# Patient Record
Sex: Female | Born: 1970 | Race: White | Hispanic: No | Marital: Married | Smoking: Current every day smoker
Health system: Southern US, Community
[De-identification: ages and names within clinical notes are randomized; demographics above are authoritative.]

## PROBLEM LIST (undated history)

## (undated) DIAGNOSIS — M542 Cervicalgia: Secondary | ICD-10-CM

## (undated) DIAGNOSIS — G8929 Other chronic pain: Secondary | ICD-10-CM

## (undated) DIAGNOSIS — I10 Essential (primary) hypertension: Secondary | ICD-10-CM

## (undated) DIAGNOSIS — K219 Gastro-esophageal reflux disease without esophagitis: Secondary | ICD-10-CM

## (undated) HISTORY — DX: Essential (primary) hypertension: I10

## (undated) HISTORY — PX: VAGINAL HYSTERECTOMY: SUR661

## (undated) HISTORY — PX: CHOLECYSTECTOMY: SHX55

## (undated) HISTORY — DX: Gastro-esophageal reflux disease without esophagitis: K21.9

---

## 1998-06-13 HISTORY — PX: CHOLECYSTECTOMY: SHX55

## 2007-06-14 HISTORY — PX: VAGINAL HYSTERECTOMY: SUR661

## 2013-09-24 ENCOUNTER — Encounter (INDEPENDENT_AMBULATORY_CARE_PROVIDER_SITE_OTHER): Payer: Self-pay | Admitting: General Surgery

## 2013-09-24 ENCOUNTER — Telehealth (INDEPENDENT_AMBULATORY_CARE_PROVIDER_SITE_OTHER): Payer: Self-pay

## 2013-09-24 ENCOUNTER — Ambulatory Visit (INDEPENDENT_AMBULATORY_CARE_PROVIDER_SITE_OTHER): Payer: PRIVATE HEALTH INSURANCE | Admitting: General Surgery

## 2013-09-24 VITALS — BP 125/75 | HR 61 | Temp 96.8°F | Resp 14 | Ht 69.0 in | Wt 154.0 lb

## 2013-09-24 DIAGNOSIS — E041 Nontoxic single thyroid nodule: Secondary | ICD-10-CM

## 2013-09-24 NOTE — Telephone Encounter (Signed)
Office notes from 09/24/13 w/Dr. Rosendo Gros faxed to Dr. Marni Griffon @ [phone].

## 2013-09-24 NOTE — Progress Notes (Signed)
Patient ID: Amanda Benton, female   DOB: [birth year], 43 y.o.   MRN: [id number]  Chief Complaint  Patient presents with  . cell lesion    HPI Amanda Benton is a 43 y.o. female.  The patient is a 43 year old female who is referred by Dr. 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 Patient states that she's had some dysphagia with eating for several years. She states that her recent bout of nausea vomiting caused this nauseates well and was brought to the attention of Dr. Henrene Pastor.  She underwent ultrasound FNA biopsy which revealed a questionable hurtle cell neoplasm, and a benign nodule.  HPI  Past Medical History  Diagnosis Date  . GERD (gastroesophageal reflux disease)   . Hypertension     Past Surgical History  Procedure Laterality Date  . Abdominal hysterectomy      Family History  Problem Relation Age of Onset  . Heart disease Father   . Cancer Sister     breast    Social History History  Substance Use Topics  . Smoking status: Current Every Day Smoker -- 1.00 packs/day  . Smokeless tobacco: Not on file  . Alcohol Use: Yes    No Known Allergies  Current Outpatient Prescriptions  Medication Sig Dispense Refill  . ibuprofen (ADVIL,MOTRIN) 200 MG tablet Take 200 mg by mouth every 6 (six) hours as needed.       No current facility-administered medications for this visit.    Review of Systems Review of Systems  Constitutional: Negative.   HENT: Negative.   Respiratory: Negative.   Cardiovascular: Negative.   Gastrointestinal: Negative.   Neurological: Negative.   All other systems reviewed and are negative.   Blood pressure 125/75, pulse 61, temperature 96.8 F (36 C), resp. rate 14, height 5\' 9"  (1.753 m), weight 154 lb (69.854 kg).  Physical Exam Physical Exam  Constitutional: She is oriented to person, place, and time. She appears well-developed and well-nourished.  HENT:  Head: Normocephalic and atraumatic.  Eyes:  Conjunctivae and EOM are normal. Pupils are equal, round, and reactive to light.  Neck: Normal range of motion. Neck supple. No thyromegaly present.  Cardiovascular: Normal rate, regular rhythm and normal heart sounds.   Pulmonary/Chest: Effort normal and breath sounds normal.  Abdominal: Soft. Bowel sounds are normal. She exhibits no distension. There is no tenderness. There is no rebound.  Musculoskeletal: Normal range of motion.  Neurological: She is alert and oriented to person, place, and time.  Skin: Skin is warm and dry.  Psychiatric: She has a normal mood and affect.    Data Reviewed As above  Assessment    44 year old female with a right thyroid nodule, possible Hrthle cell/neoplasm cannot be ruled out     Plan    1. We'll proceed to operative room for a right thyroid lobectomy. 2. All risks and benefits were discussed with the patient to generally include: infection, bleeding, damage to the recurrent laryngeal nerve, damage to superior and inferior parathyroid glands.  Alternatives were offered and described.  All questions were answered and the patient voiced understanding of the procedure and wishes to proceed at this point with a laparoscopic cholecystectomy        Amanda Benton 09/24/2013, 1:50 PM

## 2013-09-30 ENCOUNTER — Encounter (INDEPENDENT_AMBULATORY_CARE_PROVIDER_SITE_OTHER): Payer: Self-pay

## 2013-10-02 ENCOUNTER — Encounter (HOSPITAL_COMMUNITY): Payer: Self-pay | Admitting: Pharmacy Technician

## 2013-10-05 NOTE — Pre-Procedure Instructions (Signed)
[facility] - Preparing for Surgery  Before surgery, you can play an important role.  Because skin is not sterile, your skin needs to be as free of germs as possible.  You can reduce the number of germs on you skin by washing with CHG (chlorahexidine gluconate) soap before surgery.  CHG is an antiseptic cleaner which kills germs and bonds with the skin to continue killing germs even after washing.  Please DO NOT use if you have an allergy to CHG or antibacterial soaps.  If your skin becomes reddened/irritated stop using the CHG and inform your nurse when you arrive at Short Stay.  Do not shave (including legs and underarms) for at least 48 hours prior to the first CHG shower.  You may shave your face.  Please follow these instructions carefully:   1.  Shower with CHG Soap the night before surgery and the morning of Surgery.  2.  If you choose to wash your hair, wash your hair first as usual with your normal shampoo.  3.  After you shampoo, rinse your hair and body thoroughly to remove the shampoo.  4.  Use CHG as you would any other liquid soap.  You can apply CHG directly to the skin and wash gently with scrungie or a clean washcloth.  5.  Apply the CHG Soap to your body ONLY FROM THE NECK DOWN.  Do not use on open wounds or open sores.  Avoid contact with your eyes, ears, mouth and genitals (private parts).  Wash genitals (private parts) with your normal soap.  6.  Wash thoroughly, paying special attention to the area where your surgery will be performed.  7.  Thoroughly rinse your body with warm water from the neck down.  8.  DO NOT shower/wash with your normal soap after using and rinsing off the CHG Soap.  9.  Pat yourself dry with a clean towel.            10.  Wear clean pajamas.            11.  Place clean sheets on your bed the night of your first shower and do not sleep with pets.  Day of Surgery  Do not apply any lotions the morning of surgery.  Please wear clean clothes to the  hospital/surgery center.   

## 2013-10-05 NOTE — Pre-Procedure Instructions (Signed)
Amanda Benton  10/05/2013   Your procedure is scheduled on:  May 6  Report to [facility] Admitting at 1 PM.  Call this number if you have problems the morning of surgery: [phone]   Remember:   Do not eat food or drink liquids after midnight.   Take these medicines the morning of surgery with A SIP OF WATER: Tylenol (if needed)   STOP Ibuprofen April 29   STOP/ Do not take Aspirin, Aleve, Naproxen, Advil, Ibuprofen, Vitamin, Herbs, or Supplements starting April 29   Do not wear jewelry, make-up or nail polish.  Do not wear lotions, powders, or perfumes. You may wear deodorant.  Do not shave 48 hours prior to surgery. Men may shave face and neck.  Do not bring valuables to the hospital.  [facility] is not responsible  for any belongings or valuables.               Contacts, dentures or bridgework may not be worn into surgery.  Leave suitcase in the car. After surgery it may be brought to your room.  For patients admitted to the hospital, discharge time is determined by your treatment team.               Patients discharged the day of surgery will not be allowed to drive home.  Name and phone number of your driver: Family/ Friend  Special Instructions: See [facility] Preparing For Surgery   Please read over the following fact sheets that you were given: Pain Booklet, Coughing and Deep Breathing and Surgical Site Infection Prevention

## 2013-10-07 ENCOUNTER — Encounter (HOSPITAL_COMMUNITY)
Admission: RE | Admit: 2013-10-07 | Discharge: 2013-10-07 | Disposition: A | Discharge status: Home / Self Care | Payer: No Typology Code available for payment source | Source: Ambulatory Visit | Attending: Anesthesiology | Admitting: Anesthesiology

## 2013-10-07 ENCOUNTER — Encounter (HOSPITAL_COMMUNITY): Payer: Self-pay

## 2013-10-07 ENCOUNTER — Encounter (HOSPITAL_COMMUNITY)
Admission: RE | Admit: 2013-10-07 | Discharge: 2013-10-07 | Disposition: A | Discharge status: Home / Self Care | Payer: No Typology Code available for payment source | Source: Ambulatory Visit | Attending: General Surgery | Admitting: General Surgery

## 2013-10-07 DIAGNOSIS — Z01810 Encounter for preprocedural cardiovascular examination: Secondary | ICD-10-CM | POA: Insufficient documentation

## 2013-10-07 DIAGNOSIS — Z01812 Encounter for preprocedural laboratory examination: Secondary | ICD-10-CM | POA: Insufficient documentation

## 2013-10-07 DIAGNOSIS — Z01818 Encounter for other preprocedural examination: Secondary | ICD-10-CM | POA: Insufficient documentation

## 2013-10-07 LAB — BASIC METABOLIC PANEL
BUN: 12 mg/dL (ref 6–23)
CHLORIDE: 100 meq/L (ref 96–112)
CO2: 25 mEq/L (ref 19–32)
CREATININE: 0.84 mg/dL (ref 0.50–1.10)
Calcium: 9.7 mg/dL (ref 8.4–10.5)
GFR calc non Af Amer: 83 mL/min — ABNORMAL LOW (ref >90)
Glucose, Bld: 99 mg/dL (ref 70–99)
Potassium: 4.3 mEq/L (ref 3.7–5.3)
Sodium: 138 mEq/L (ref 137–147)

## 2013-10-07 LAB — CBC
HEMATOCRIT: 40.9 % (ref 36.0–46.0)
Hemoglobin: 14.3 g/dL (ref 12.0–15.0)
MCH: 31 pg (ref 26.0–34.0)
MCHC: 35 g/dL (ref 30.0–36.0)
MCV: 88.7 fL (ref 78.0–100.0)
PLATELETS: 189 10*3/uL (ref 150–400)
RBC: 4.61 MIL/uL (ref 3.87–5.11)
RDW: 13.1 % (ref 11.5–15.5)
WBC: 7.4 10*3/uL (ref 4.0–10.5)

## 2013-10-07 NOTE — Progress Notes (Signed)
Primary physician - dr. Henrene Pastor at [facility] Stress test, ekg several years ago - [facility] she thinks - will request

## 2013-10-08 NOTE — Progress Notes (Signed)
Anesthesia Chart Review:  Patient is a 43 year old female scheduled for right thyroid lobectomy for nodules on 10/16/13 by Dr. Rosendo Gros.    History includes HTN, GERD, smoking, hysterectomy, cholecystectomy.  PCP is Dr. Henrene Pastor at [facility]. She lives in [place].  EKG on 10/07/13 showed marked SB @ 46 bpm, incomplete right BBB. (HR was previously documented as 50 and 61 bpm in Epic since 09/24/13.)  She saw cardiologist Dr. Desiree Lucy in 05/2011 for chest pain, incomplete right BBB on EKG, with family history of early onset CAD.  He ordered a stress echo which was done on 06/23/11 and showed: Normal stress echo, negative for ischemia, LVEF 55-60%, normal LVF with no regional wall motion abnormalities.  PRN cardiologist follow-up was recommended at that time. 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    Preoperative CXR and labs noted.   If no acute changes, new CV symptoms, or symptomatic bradycardia then I would anticipate that she could proceed as planned.  Further evaluation by her assigned anesthesiologist on the day of surgery.  [facility] Short Stay Center/Anesthesiology Phone [phone] 10/08/2013 4:58 PM

## 2013-10-15 MED ORDER — CEFAZOLIN SODIUM-DEXTROSE 2-3 GM-% IV SOLR
2.0000 g | INTRAVENOUS | Status: AC
  Administered 2013-10-16: 2 g via INTRAVENOUS
  Filled 2013-10-15: qty 50

## 2013-10-16 ENCOUNTER — Encounter (HOSPITAL_COMMUNITY): Payer: Self-pay | Admitting: *Deleted

## 2013-10-16 ENCOUNTER — Encounter (HOSPITAL_COMMUNITY)
Admission: RE | Disposition: A | Discharge status: Home / Self Care | Payer: Self-pay | Source: Ambulatory Visit | Attending: General Surgery

## 2013-10-16 ENCOUNTER — Encounter (HOSPITAL_COMMUNITY): Payer: No Typology Code available for payment source | Admitting: Vascular Surgery

## 2013-10-16 ENCOUNTER — Observation Stay (HOSPITAL_COMMUNITY)
Admission: RE | Admit: 2013-10-16 | Discharge: 2013-10-17 | Disposition: A | Discharge status: Home / Self Care | Payer: No Typology Code available for payment source | Source: Ambulatory Visit | Attending: General Surgery | Admitting: General Surgery

## 2013-10-16 ENCOUNTER — Ambulatory Visit (HOSPITAL_COMMUNITY): Payer: No Typology Code available for payment source | Admitting: Certified Registered"

## 2013-10-16 DIAGNOSIS — F17200 Nicotine dependence, unspecified, uncomplicated: Secondary | ICD-10-CM | POA: Insufficient documentation

## 2013-10-16 DIAGNOSIS — I10 Essential (primary) hypertension: Secondary | ICD-10-CM | POA: Insufficient documentation

## 2013-10-16 DIAGNOSIS — Z9889 Other specified postprocedural states: Secondary | ICD-10-CM

## 2013-10-16 DIAGNOSIS — D34 Benign neoplasm of thyroid gland: Secondary | ICD-10-CM

## 2013-10-16 DIAGNOSIS — K219 Gastro-esophageal reflux disease without esophagitis: Secondary | ICD-10-CM | POA: Insufficient documentation

## 2013-10-16 DIAGNOSIS — E890 Postprocedural hypothyroidism: Secondary | ICD-10-CM

## 2013-10-16 DIAGNOSIS — E041 Nontoxic single thyroid nodule: Principal | ICD-10-CM | POA: Insufficient documentation

## 2013-10-16 HISTORY — DX: Postprocedural hypothyroidism: E89.0

## 2013-10-16 HISTORY — PX: THYROID LOBECTOMY: SHX420

## 2013-10-16 HISTORY — DX: Cervicalgia: M54.2

## 2013-10-16 HISTORY — DX: Other chronic pain: G89.29

## 2013-10-16 SURGERY — LOBECTOMY, THYROID
Anesthesia: General | Site: Neck | Laterality: Right

## 2013-10-16 MED ORDER — MIDAZOLAM HCL 5 MG/5ML IJ SOLN
INTRAMUSCULAR | Status: DC | PRN
  Administered 2013-10-16: 2 mg via INTRAVENOUS

## 2013-10-16 MED ORDER — CHLORHEXIDINE GLUCONATE 4 % EX LIQD
1.0000 "application " | Freq: Once | CUTANEOUS | Status: DC
  Filled 2013-10-16: qty 15

## 2013-10-16 MED ORDER — SODIUM CHLORIDE 0.9 % IJ SOLN
INTRAMUSCULAR | Status: AC
  Filled 2013-10-16: qty 10

## 2013-10-16 MED ORDER — ROCURONIUM BROMIDE 100 MG/10ML IV SOLN
INTRAVENOUS | Status: DC | PRN
  Administered 2013-10-16: 40 mg via INTRAVENOUS

## 2013-10-16 MED ORDER — PROMETHAZINE HCL 25 MG/ML IJ SOLN
6.2500 mg | INTRAMUSCULAR | Status: DC | PRN
  Administered 2013-10-16: 6.25 mg via INTRAVENOUS

## 2013-10-16 MED ORDER — DEXTROSE-NACL 5-0.9 % IV SOLN
INTRAVENOUS | Status: DC
  Administered 2013-10-16: 21:00:00 via INTRAVENOUS

## 2013-10-16 MED ORDER — OXYCODONE HCL 5 MG PO TABS: 5.0000 mg | ORAL_TABLET | ORAL | Status: DC | PRN

## 2013-10-16 MED ORDER — ONDANSETRON HCL 4 MG/2ML IJ SOLN
4.0000 mg | Freq: Four times a day (QID) | INTRAMUSCULAR | Status: DC | PRN
  Administered 2013-10-16: 4 mg via INTRAVENOUS
  Filled 2013-10-16: qty 2

## 2013-10-16 MED ORDER — BUPIVACAINE HCL (PF) 0.25 % IJ SOLN
INTRAMUSCULAR | Status: AC
  Filled 2013-10-16: qty 30

## 2013-10-16 MED ORDER — BUPIVACAINE HCL 0.25 % IJ SOLN
INTRAMUSCULAR | Status: DC | PRN
  Administered 2013-10-16: 10 mL

## 2013-10-16 MED ORDER — 0.9 % SODIUM CHLORIDE (POUR BTL) OPTIME
TOPICAL | Status: DC | PRN
  Administered 2013-10-16: 1000 mL

## 2013-10-16 MED ORDER — LIDOCAINE HCL (CARDIAC) 20 MG/ML IV SOLN
INTRAVENOUS | Status: DC | PRN
  Administered 2013-10-16: 80 mg via INTRAVENOUS

## 2013-10-16 MED ORDER — PROPOFOL 10 MG/ML IV BOLUS
INTRAVENOUS | Status: AC
  Filled 2013-10-16: qty 20

## 2013-10-16 MED ORDER — MIDAZOLAM HCL 2 MG/2ML IJ SOLN
INTRAMUSCULAR | Status: AC
  Filled 2013-10-16: qty 2

## 2013-10-16 MED ORDER — SUFENTANIL CITRATE 50 MCG/ML IV SOLN
INTRAVENOUS | Status: AC
  Filled 2013-10-16: qty 1

## 2013-10-16 MED ORDER — SUFENTANIL CITRATE 50 MCG/ML IV SOLN
INTRAVENOUS | Status: DC | PRN
  Administered 2013-10-16: 20 ug via INTRAVENOUS
  Administered 2013-10-16: 5 ug via INTRAVENOUS

## 2013-10-16 MED ORDER — ROCURONIUM BROMIDE 50 MG/5ML IV SOLN
INTRAVENOUS | Status: AC
  Filled 2013-10-16: qty 1

## 2013-10-16 MED ORDER — HYDROMORPHONE HCL PF 1 MG/ML IJ SOLN
INTRAMUSCULAR | Status: AC
  Administered 2013-10-16: 0.5 mg via INTRAVENOUS
  Filled 2013-10-16: qty 1

## 2013-10-16 MED ORDER — OXYCODONE HCL 5 MG PO TABS: 5.0000 mg | ORAL_TABLET | Freq: Once | ORAL | Status: DC | PRN

## 2013-10-16 MED ORDER — OXYCODONE HCL 5 MG/5ML PO SOLN: 5.0000 mg | Freq: Once | ORAL | Status: DC | PRN

## 2013-10-16 MED ORDER — HEMOSTATIC AGENTS (NO CHARGE) OPTIME
TOPICAL | Status: DC | PRN
  Administered 2013-10-16: 1 via TOPICAL

## 2013-10-16 MED ORDER — HYDROMORPHONE HCL PF 1 MG/ML IJ SOLN
0.2500 mg | INTRAMUSCULAR | Status: DC | PRN
  Administered 2013-10-16 (×2): 0.5 mg via INTRAVENOUS

## 2013-10-16 MED ORDER — OXYCODONE-ACETAMINOPHEN 10-325 MG PO TABS: 1.0000 | ORAL_TABLET | ORAL | Status: DC | PRN

## 2013-10-16 MED ORDER — MIDAZOLAM HCL 2 MG/2ML IJ SOLN: 1.0000 mg | INTRAMUSCULAR | Status: DC | PRN

## 2013-10-16 MED ORDER — LIDOCAINE HCL (CARDIAC) 20 MG/ML IV SOLN
INTRAVENOUS | Status: AC
  Filled 2013-10-16: qty 5

## 2013-10-16 MED ORDER — HYDROMORPHONE HCL PF 1 MG/ML IJ SOLN
1.0000 mg | INTRAMUSCULAR | Status: DC | PRN
  Administered 2013-10-16: 1 mg via INTRAVENOUS
  Filled 2013-10-16: qty 1

## 2013-10-16 MED ORDER — ONDANSETRON HCL 4 MG/2ML IJ SOLN
INTRAMUSCULAR | Status: DC | PRN
  Administered 2013-10-16: 4 mg via INTRAVENOUS

## 2013-10-16 MED ORDER — ONDANSETRON HCL 4 MG/2ML IJ SOLN
INTRAMUSCULAR | Status: AC
  Filled 2013-10-16: qty 2

## 2013-10-16 MED ORDER — FENTANYL CITRATE 0.05 MG/ML IJ SOLN: 50.0000 ug | Freq: Once | INTRAMUSCULAR | Status: DC

## 2013-10-16 MED ORDER — LACTATED RINGERS IV SOLN
INTRAVENOUS | Status: DC
  Administered 2013-10-16: 13:00:00 via INTRAVENOUS

## 2013-10-16 MED ORDER — PROMETHAZINE HCL 25 MG/ML IJ SOLN: 12.5000 mg | Freq: Once | INTRAMUSCULAR | Status: DC

## 2013-10-16 MED ORDER — PROMETHAZINE HCL 25 MG/ML IJ SOLN
INTRAMUSCULAR | Status: AC
Start: 2013-10-16 — End: 2013-10-17
  Filled 2013-10-16: qty 1

## 2013-10-16 MED ORDER — PHENOL 1.4 % MT LIQD: 1.0000 | OROMUCOSAL | Status: DC | PRN

## 2013-10-16 MED ORDER — PROPOFOL 10 MG/ML IV BOLUS
INTRAVENOUS | Status: DC | PRN
  Administered 2013-10-16: 180 mg via INTRAVENOUS

## 2013-10-16 SURGICAL SUPPLY — 67 items
ATTRACTOMAT 16X20 MAGNETIC DRP (DRAPES) IMPLANT
BENZOIN TINCTURE PRP APPL 2/3 (GAUZE/BANDAGES/DRESSINGS) IMPLANT
BLADE 15 SAFETY STRL DISP (BLADE) IMPLANT
CANISTER SUCTION 2500CC (MISCELLANEOUS) ×3 IMPLANT
CHLORAPREP W/TINT 26ML (MISCELLANEOUS) ×3 IMPLANT
CLIP TI MEDIUM 24 (CLIP) ×3 IMPLANT
CLIP TI WIDE RED SMALL 24 (CLIP) ×3 IMPLANT
CLOSURE WOUND 1/2 X4 (GAUZE/BANDAGES/DRESSINGS)
CONT SPEC 4OZ CLIKSEAL STRL BL (MISCELLANEOUS) IMPLANT
COVER SURGICAL LIGHT HANDLE (MISCELLANEOUS) ×3 IMPLANT
CRADLE DONUT ADULT HEAD (MISCELLANEOUS) ×3 IMPLANT
DERMABOND ADVANCED (GAUZE/BANDAGES/DRESSINGS) ×2
DERMABOND ADVANCED .7 DNX12 (GAUZE/BANDAGES/DRESSINGS) ×1 IMPLANT
DRAPE PED LAPAROTOMY (DRAPES) ×3 IMPLANT
DRAPE SURG 17X23 STRL (DRAPES) ×6 IMPLANT
DRAPE UTILITY 15X26 W/TAPE STR (DRAPE) ×6 IMPLANT
ELECT CAUTERY BLADE 6.4 (BLADE) IMPLANT
ELECT COATED BLADE 2.86 ST (ELECTRODE) ×3 IMPLANT
ELECT REM PT RETURN 9FT ADLT (ELECTROSURGICAL) ×3
ELECTRODE REM PT RTRN 9FT ADLT (ELECTROSURGICAL) ×1 IMPLANT
GAUZE SPONGE 4X4 16PLY XRAY LF (GAUZE/BANDAGES/DRESSINGS) ×6 IMPLANT
GLOVE BIO SURGEON STRL SZ 6.5 (GLOVE) ×2 IMPLANT
GLOVE BIO SURGEON STRL SZ7.5 (GLOVE) ×9 IMPLANT
GLOVE BIO SURGEONS STRL SZ 6.5 (GLOVE) ×1
GLOVE BIOGEL PI IND STRL 7.0 (GLOVE) ×1 IMPLANT
GLOVE BIOGEL PI IND STRL 7.5 (GLOVE) ×2 IMPLANT
GLOVE BIOGEL PI IND STRL 8 (GLOVE) ×1 IMPLANT
GLOVE BIOGEL PI INDICATOR 7.0 (GLOVE) ×2
GLOVE BIOGEL PI INDICATOR 7.5 (GLOVE) ×4
GLOVE BIOGEL PI INDICATOR 8 (GLOVE) ×2
GLOVE SS BIOGEL STRL SZ 6.5 (GLOVE) ×1 IMPLANT
GLOVE SUPERSENSE BIOGEL SZ 6.5 (GLOVE) ×2
GLOVE SURG SS PI 7.0 STRL IVOR (GLOVE) ×3 IMPLANT
GOWN STRL REUS W/ TWL LRG LVL3 (GOWN DISPOSABLE) ×4 IMPLANT
GOWN STRL REUS W/ TWL XL LVL3 (GOWN DISPOSABLE) ×1 IMPLANT
GOWN STRL REUS W/TWL LRG LVL3 (GOWN DISPOSABLE) ×8
GOWN STRL REUS W/TWL XL LVL3 (GOWN DISPOSABLE) ×2
HEMOSTAT SURGICEL 2X14 (HEMOSTASIS) IMPLANT
HEMOSTAT SURGICEL 2X4 FIBR (HEMOSTASIS) ×3 IMPLANT
KIT BASIN OR (CUSTOM PROCEDURE TRAY) ×3 IMPLANT
KIT ROOM TURNOVER OR (KITS) ×3 IMPLANT
NEEDLE HYPO 25GX1X1/2 BEV (NEEDLE) ×3 IMPLANT
NS IRRIG 1000ML POUR BTL (IV SOLUTION) ×3 IMPLANT
PACK SURGICAL SETUP 50X90 (CUSTOM PROCEDURE TRAY) ×3 IMPLANT
PAD ARMBOARD 7.5X6 YLW CONV (MISCELLANEOUS) ×6 IMPLANT
PENCIL BUTTON HOLSTER BLD 10FT (ELECTRODE) ×3 IMPLANT
SHEARS HARMONIC 9CM CVD (BLADE) ×3 IMPLANT
SPECIMEN JAR MEDIUM (MISCELLANEOUS) IMPLANT
SPECIMEN JAR SMALL (MISCELLANEOUS) ×3 IMPLANT
SPONGE GAUZE 4X4 12PLY (GAUZE/BANDAGES/DRESSINGS) IMPLANT
SPONGE INTESTINAL PEANUT (DISPOSABLE) ×3 IMPLANT
STAPLER VISISTAT 35W (STAPLE) IMPLANT
STRIP CLOSURE SKIN 1/2X4 (GAUZE/BANDAGES/DRESSINGS) IMPLANT
SUT MNCRL AB 4-0 PS2 18 (SUTURE) ×3 IMPLANT
SUT SILK 2 0 (SUTURE) ×2
SUT SILK 2-0 18XBRD TIE 12 (SUTURE) ×1 IMPLANT
SUT SILK 3 0 (SUTURE) ×2
SUT SILK 3-0 18XBRD TIE 12 (SUTURE) ×1 IMPLANT
SUT VIC AB 3-0 SH 18 (SUTURE) ×6 IMPLANT
SYR BULB 3OZ (MISCELLANEOUS) ×3 IMPLANT
SYR CONTROL 10ML LL (SYRINGE) ×3 IMPLANT
TOWEL OR 17X24 6PK STRL BLUE (TOWEL DISPOSABLE) IMPLANT
TOWEL OR 17X26 10 PK STRL BLUE (TOWEL DISPOSABLE) ×3 IMPLANT
TOWEL OR NON WOVEN STRL DISP B (DISPOSABLE) ×3 IMPLANT
TUBE CONNECTING 12'X1/4 (SUCTIONS) ×1
TUBE CONNECTING 12X1/4 (SUCTIONS) ×2 IMPLANT
WATER STERILE IRR 1000ML POUR (IV SOLUTION) IMPLANT

## 2013-10-16 NOTE — Op Note (Signed)
10/16/2013  3:32 PM  PATIENT:  Amanda Benton  43 y.o. female  PRE-OPERATIVE DIAGNOSIS:  RIGHT THYROID NODULE  POST-OPERATIVE DIAGNOSIS:  RIGHT THYROID NODULE  PROCEDURE:  Procedure(s): RIGHT THYROID LOBECTOMY (Right)  SURGEON:  Surgeon(s) and Role:    * [name], MD - Primary    ASSISTANTS: Algis Greenhouse, RNFA   ANESTHESIA:   local and general  EBL:   5cc  BLOOD ADMINISTERED:none  DRAINS: none   LOCAL MEDICATIONS USED:  BUPIVICAINE   SPECIMEN:  Source of Specimen:  Right thyroid lobe, stitch marks Right superior lobe  DISPOSITION OF SPECIMEN:  PATHOLOGY  COUNTS:  YES  TOURNIQUET:  * No tourniquets in log *  DICTATION: .Dragon Dictation  Details of the procedure:  The patient was taken back to the operating room. The patient was placed in supine position with bilateral SCDs in place. After appropriate anitbiotics were confirmed, a time-out was confirmed and all facts were verified. A 4 cm incision was made approximately 2 fingerbreadths above the sternal notch. Bovie cautery was used to maintain hemostasis dissection was carried down through the platysma. The platysma was elevated and flaps were created superiorly and inferiorly to the thyroid cartilage as well as the sternal notch, repsectively. The strap muscles were identified in the midline and separated. Right-sided strap muscles were elevated off the anterior surface of the thyroid. This dissection was carried laterally. The middle thyroid vein was identified and doubly ligated. We proceeded to dissect away the superior lobe and Kitners were used to gently dissect the surrounding musculature from the thyroid. The superior thyroid vessels were identified and doubly ligated with clips and transected with a Harmonic scalpel. At this time this freed up the superior lobe was able to deliver this into the wound. We also identified the superior parathyroid gland which we preserved. We continued to dissect the thyroid off  of the trachea from lateral to medial direction. The right recurrent laryngeal nerve was identified and protected. We proceeded to dissect the thyroid inferiorly. The inferior thyroid vessels were identified and doubly ligated with clips. At this time Berry's ligament was dissected away from the trachea. This deliver the right lobe of the thyroid into the wound and the harmonic scalpel was used to divide the thyroid in the midline. A superior stitch was then placed in the superior thyroid lobe. The area was irrigated out. The dissection bed was hemostatic. We placed fibrillar hemostatic agent into the wound. Strap muscles were then reapproximated in the midline with interrupted 3-0 Vicryl stitches. The platysma was reapproximated using 3-0 Vicryl stitches in interrupted fashion. Skin was then reapproximated using a running subcuticular 4-0 Monocryl. The skin was then dressed with Dermabond. The patient was taken to the recovery room in stable condition.    PLAN OF CARE: Admit for overnight observation  PATIENT DISPOSITION:  PACU - hemodynamically stable.   Delay start of Pharmacological VTE agent (>24hrs) due to surgical blood loss or risk of bleeding: yes

## 2013-10-16 NOTE — Anesthesia Postprocedure Evaluation (Signed)
Anesthesia Post Note  Patient: Amanda Benton  Procedure(s) Performed: Procedure(s) (LRB): RIGHT THYROID LOBECTOMY (Right)  Anesthesia type: General  Patient location: PACU  Post pain: Pain level controlled  Post assessment: Patient's Cardiovascular Status Stable  Last Vitals:  Filed Vitals:   10/16/13 1715  BP: 159/71  Pulse: 55  Temp:   Resp: 13    Post vital signs: Reviewed and stable  Level of consciousness: alert  Complications: No apparent anesthesia complications

## 2013-10-16 NOTE — Interval H&P Note (Signed)
History and Physical Interval Note:  10/16/2013 1:51 PM  Amanda Benton  has presented today for surgery, with the diagnosis of right thyroid nodule   The various methods of treatment have been discussed with the patient and family. After consideration of risks, benefits and other options for treatment, the patient has consented to  Procedure(s): RIGHT THYROID LOBECTOMY (Right) as a surgical intervention .  The patient's history has been reviewed, patient examined, no change in status, stable for surgery.  I have reviewed the patient's chart and labs.  Questions were answered to the patient's satisfaction.     [name]

## 2013-10-16 NOTE — Discharge Instructions (Signed)
[facility]      [facility] °[phone] ° °THYROID/ PARATHYROID SURGERY: POST OP INSTRUCTIONS ° °Always review your discharge instruction sheet given to you by the facility where your surgery was performed. ° °IF YOU HAVE DISABILITY OR FAMILY LEAVE FORMS, YOU MUST BRING THEM TO THE OFFICE FOR PROCESSING.  PLEASE DO NOT GIVE THEM TO YOUR DOCTOR. ° °1. A prescription for pain medication may be given to you upon discharge.  Take your pain medication as prescribed, if needed.  If narcotic pain medicine is not needed, then you may take acetaminophen (Tylenol) or ibuprofen (Advil) as needed. °2. Take your usually prescribed medications unless otherwise directed. °3. If you need a refill on your pain medication, please contact your pharmacy. They will contact our office to request authorization.  Prescriptions will not be filled after 5pm or on week-ends. °4. You should follow a light diet the first 24 hours after arrival home, such as soup and crackers, etc.  Be sure to include lots of fluids daily.  Resume your normal diet the day after surgery. °5. Most patients will experience some swelling and bruising on the chest and neck area.  Ice packs will help.  Swelling and bruising can take several days to resolve.  °6. It is common to experience some constipation if taking pain medication after surgery.  Increasing fluid intake and taking a stool softener will usually help or prevent this problem from occurring.  A mild laxative (Milk of Magnesia or Miralax) should be taken according to package directions if there are no bowel movements after 48 hours. °7. Unless discharge instructions indicate otherwise, you may remove your bandages 24-48 hours after surgery, and you may shower at that time.  You may have steri-strips (small skin tapes) in place directly over the incision.  These strips should be left on the skin for 7-10 days.  If your surgeon used skin glue on the incision, you may shower in 24 hours.  The  glue will flake off over the next 2-3 weeks.  Any sutures or staples will be removed at the office during your follow-up visit. °8. ACTIVITIES:  You may resume regular (light) daily activities beginning the next day--such as daily self-care, walking, climbing stairs--gradually increasing activities as tolerated.  You may have sexual intercourse when it is comfortable.  Refrain from any heavy lifting or straining until approved by your doctor. °a. You may drive when you no longer are taking prescription pain medication, you can comfortably wear a seatbelt, and you can safely maneuver your car and apply brakes °b. RETURN TO WORK:  __________________________________________________________ °9. You should see your doctor in the office for a follow-up appointment approximately two weeks after your surgery.  Make sure that you call for this appointment within a day or two after you arrive home to insure a convenient appointment time. °10. OTHER INSTRUCTIONS: ____________________________________________________________________________ _________________________________________________________________________________________________________________ °_________________________________________________________________________________________________________________ ° ° °WHEN TO CALL YOUR DOCTOR: °1. Fever over 101.0 °2. Inability to urinate °3. Nausea and/or vomiting °4. Extreme swelling or bruising °5. Continued bleeding from incision. °6. Increased pain, redness, or drainage from the incision. °7. Difficulty swallowing or breathing °8. Muscle cramping or spasms. °9. Numbness or tingling in hands or feet or around lips. ° °The clinic staff is available to answer your questions during regular business hours.  Please don’t hesitate to call and ask to speak to one of the nurses if you have concerns. ° °For further questions, please visit [email or web address] °

## 2013-10-16 NOTE — Transfer of Care (Signed)
Immediate Anesthesia Transfer of Care Note  Patient: Amanda Benton  Procedure(s) Performed: Procedure(s): RIGHT THYROID LOBECTOMY (Right)  Patient Location: PACU  Anesthesia Type:General  Level of Consciousness: awake, alert  and oriented  Airway & Oxygen Therapy: Patient Spontanous Breathing and Patient connected to nasal cannula oxygen  Post-op Assessment: Report given to PACU RN, Post -op Vital signs reviewed and stable and Patient moving all extremities  Post vital signs: Reviewed and stable  Complications: No apparent anesthesia complications

## 2013-10-16 NOTE — H&P (View-Only) (Signed)
Patient ID: Amanda Benton, female   DOB: 12/28/1970, 42 y.o.   MRN: [id number]  Chief Complaint  Patient presents with  . cell lesion    HPI Amanda Benton is a 42 y.o. female.  The patient is a 42-year-old female who is referred by Dr. 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 Patient states that she's had some dysphagia with eating for several years. She states that her recent bout of nausea vomiting caused this nauseates well and was brought to the attention of Dr. Perry.  She underwent ultrasound FNA biopsy which revealed a questionable hurtle cell neoplasm, and a benign nodule.  HPI  Past Medical History  Diagnosis Date  . GERD (gastroesophageal reflux disease)   . Hypertension     Past Surgical History  Procedure Laterality Date  . Abdominal hysterectomy      Family History  Problem Relation Age of Onset  . Heart disease Father   . Cancer Sister     breast    Social History History  Substance Use Topics  . Smoking status: Current Every Day Smoker -- 1.00 packs/day  . Smokeless tobacco: Not on file  . Alcohol Use: Yes    No Known Allergies  Current Outpatient Prescriptions  Medication Sig Dispense Refill  . ibuprofen (ADVIL,MOTRIN) 200 MG tablet Take 200 mg by mouth every 6 (six) hours as needed.       No current facility-administered medications for this visit.    Review of Systems Review of Systems  Constitutional: Negative.   HENT: Negative.   Respiratory: Negative.   Cardiovascular: Negative.   Gastrointestinal: Negative.   Neurological: Negative.   All other systems reviewed and are negative.   Blood pressure 125/75, pulse 61, temperature 96.8 F (36 C), resp. rate 14, height 5' 9" (1.753 m), weight 154 lb (69.854 kg).  Physical Exam Physical Exam  Constitutional: She is oriented to person, place, and time. She appears well-developed and well-nourished.  HENT:  Head: Normocephalic and atraumatic.  Eyes:  Conjunctivae and EOM are normal. Pupils are equal, round, and reactive to light.  Neck: Normal range of motion. Neck supple. No thyromegaly present.  Cardiovascular: Normal rate, regular rhythm and normal heart sounds.   Pulmonary/Chest: Effort normal and breath sounds normal.  Abdominal: Soft. Bowel sounds are normal. She exhibits no distension. There is no tenderness. There is no rebound.  Musculoskeletal: Normal range of motion.  Neurological: She is alert and oriented to person, place, and time.  Skin: Skin is warm and dry.  Psychiatric: She has a normal mood and affect.    Data Reviewed As above  Assessment    42-year-old female with a right thyroid nodule, possible Hrthle cell/neoplasm cannot be ruled out     Plan    1. We'll proceed to operative room for a right thyroid lobectomy. 2. All risks and benefits were discussed with the patient to generally include: infection, bleeding, damage to the recurrent laryngeal nerve, damage to superior and inferior parathyroid glands.  Alternatives were offered and described.  All questions were answered and the patient voiced understanding of the procedure and wishes to proceed at this point with a laparoscopic cholecystectomy        Amanda Benton 09/24/2013, 1:50 PM    

## 2013-10-16 NOTE — Anesthesia Preprocedure Evaluation (Signed)
Anesthesia Evaluation  Patient identified by MRN, date of birth, ID band Patient awake    Reviewed: Allergy & Precautions, H&P , NPO status , Patient's Chart, lab work & pertinent test results  Airway Mallampati: I TM Distance: >3 FB Neck ROM: Full    Dental   Pulmonary COPDCurrent Smoker,  breath sounds clear to auscultation        Cardiovascular hypertension, Rhythm:Regular Rate:Normal     Neuro/Psych    GI/Hepatic GERD-  ,  Endo/Other    Renal/GU      Musculoskeletal   Abdominal   Peds  Hematology   Anesthesia Other Findings   Reproductive/Obstetrics                           Anesthesia Physical Anesthesia Plan  ASA: II  Anesthesia Plan: General   Post-op Pain Management:    Induction: Intravenous  Airway Management Planned: Oral ETT  Additional Equipment:   Intra-op Plan:   Post-operative Plan: Extubation in OR  Informed Consent: I have reviewed the patients History and Physical, chart, labs and discussed the procedure including the risks, benefits and alternatives for the proposed anesthesia with the patient or authorized representative who has indicated his/her understanding and acceptance.     Plan Discussed with: CRNA and Surgeon  Anesthesia Plan Comments:         Anesthesia Quick Evaluation

## 2013-10-17 ENCOUNTER — Encounter (INDEPENDENT_AMBULATORY_CARE_PROVIDER_SITE_OTHER): Payer: Self-pay

## 2013-10-17 NOTE — Progress Notes (Signed)
Patient discharged to home with instructions. 

## 2013-10-18 ENCOUNTER — Encounter (HOSPITAL_COMMUNITY): Payer: Self-pay | Admitting: General Surgery

## 2013-10-18 NOTE — Discharge Summary (Signed)
Physician Discharge Summary  Patient ID: CASEY MAXFIELD MRN: [id number] DOB/AGE: 08-17-1970 43 y.o.  Admit date: 10/16/2013 Discharge date: 10/18/2013  Admission Diagnoses: s/p r throidectomy  Discharge Diagnoses:  Active Problems:   S/P partial thyroidectomy   Discharged Condition: good  Hospital Course: Pt was admitted post op. She was trasnistioned to a CLD and adv to a reg diet as tol. She was ambulating well. She had good pain control. She was deemed stable for DC and [place] home.  Consults: None  Significant Diagnostic Studies: none  Treatments: surgery: as above  Discharge Exam: Blood pressure 115/58, pulse 62, temperature 98.3 F (36.8 C), temperature source Oral, resp. rate 17, height 5\' 9"  (1.753 m), weight 152 lb 12.5 oz (69.3 kg), SpO2 99.00%. General appearance: alert and cooperative Resp: clear to auscultation bilaterally Cardio: regular rate and rhythm, S1, S2 normal, no murmur, click, rub or gallop GI: soft, non-tender; bowel sounds normal; no masses,  no organomegaly neck wound c/d/i  Disposition: 01-Home or Self Care  Discharge Orders   Future Appointments Provider Department Dept Phone   10/30/2013 4:40 PM [name], MD [facility] [place] [phone]   Future Orders Complete By Expires   Diet - low sodium heart healthy  As directed    Increase activity slowly  As directed        Medication List         acetaminophen 325 MG tablet  Commonly known as:  TYLENOL  Take 325 mg by mouth every 6 (six) hours as needed for moderate pain.     ibuprofen 200 MG tablet  Commonly known as:  ADVIL,MOTRIN  Take 400 mg by mouth every 6 (six) hours as needed for moderate pain.     oxyCODONE-acetaminophen 10-325 MG per tablet  Commonly known as:  PERCOCET  Take 1 tablet by mouth every 4 (four) hours as needed for pain.           Follow-up Information   Follow up with Reyes Ivan, MD. Schedule an appointment as soon as possible for a  visit in 2 weeks. (For wound re-check)    Specialty:  General Surgery   Contact information:   [address] [place] [address] [phone]       Signed: [name] 10/18/2013, 6:59 AM

## 2013-10-24 ENCOUNTER — Telehealth (INDEPENDENT_AMBULATORY_CARE_PROVIDER_SITE_OTHER): Payer: Self-pay | Admitting: General Surgery

## 2013-10-24 NOTE — Telephone Encounter (Signed)
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 The patient has a followup appointment and will see [place] at that time.

## 2013-10-30 ENCOUNTER — Ambulatory Visit (INDEPENDENT_AMBULATORY_CARE_PROVIDER_SITE_OTHER): Payer: PRIVATE HEALTH INSURANCE | Admitting: General Surgery

## 2013-10-30 ENCOUNTER — Encounter (INDEPENDENT_AMBULATORY_CARE_PROVIDER_SITE_OTHER): Payer: Self-pay | Admitting: General Surgery

## 2013-10-30 VITALS — BP 130/80 | HR 67 | Temp 98.3°F | Ht 69.0 in | Wt 144.0 lb

## 2013-10-30 DIAGNOSIS — E890 Postprocedural hypothyroidism: Secondary | ICD-10-CM

## 2013-10-30 DIAGNOSIS — Z9889 Other specified postprocedural states: Secondary | ICD-10-CM

## 2013-10-30 NOTE — Progress Notes (Signed)
Patient ID: Amanda Benton, female   DOB: [birth year], 43 y.o.   MRN: [id number] Post op course The patient is a 43 year old female status post thyroid lobectomy. Patient then did well postoperative. The patient has had no trouble with swallowing  On Exam: Wounds are clean dry and intact  Pathology:   Hurthle cell adenoma, no malignancy.  This was discussed with the patient.  Assessment and Plan 43 year old female status post thyroid lobectomy 1. Patient follow up as needed   [name], MD [facility] General & Minimally Invasive Surgery Trauma & Emergency Surgery

## 2013-11-13 ENCOUNTER — Telehealth (INDEPENDENT_AMBULATORY_CARE_PROVIDER_SITE_OTHER): Payer: Self-pay

## 2013-11-13 NOTE — Telephone Encounter (Signed)
Office notes faxed to Dr. Henrene Pastor.  Fax confirmation rec'd

## 2014-09-14 IMAGING — CR DG CHEST 2V
2 series · 2 of 2 positions shown · non-contrast
Comparison: None.

CLINICAL DATA: Right thyroid lobectomy.  Preop radiograph.

EXAM:
CHEST  2 VIEW

[w chest pa]
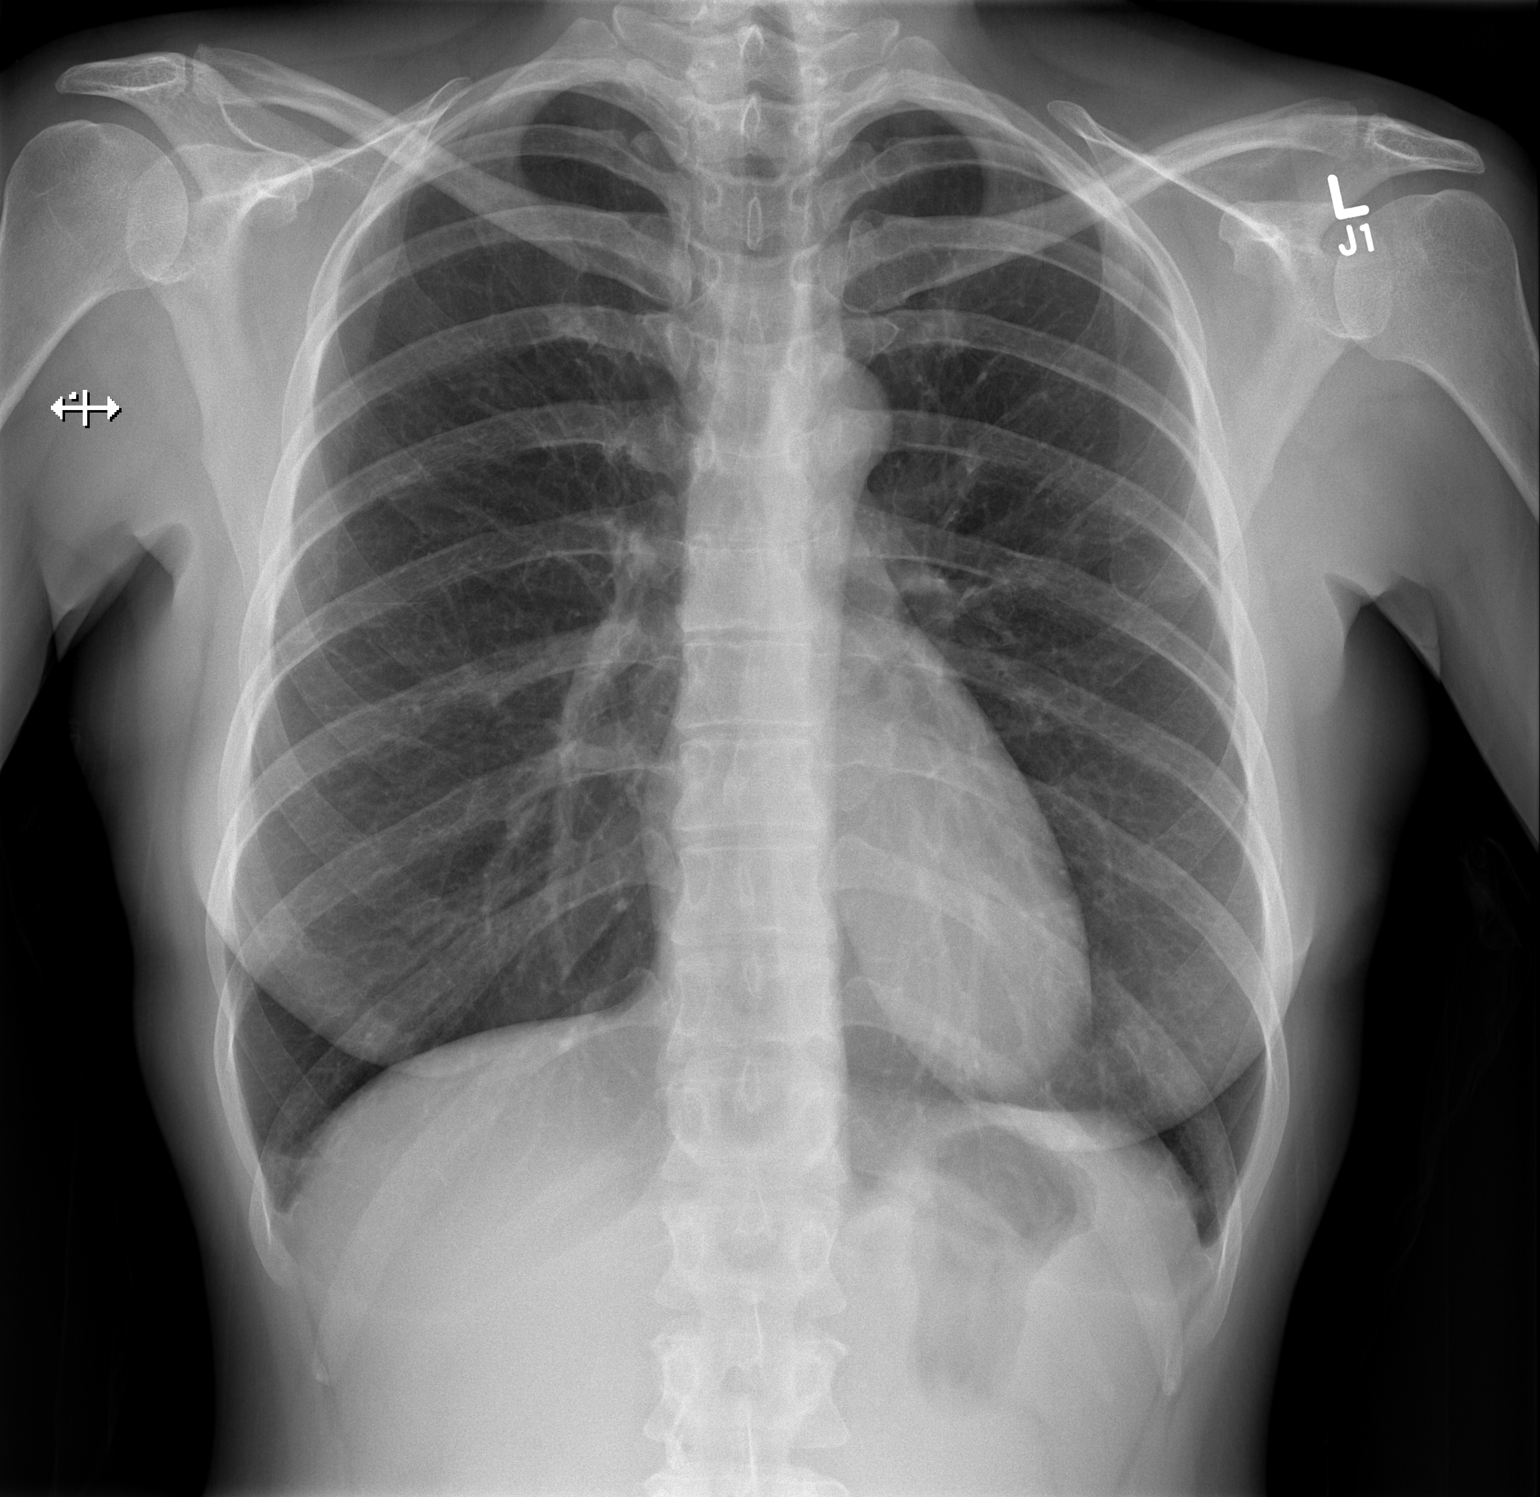

[w chest lat]
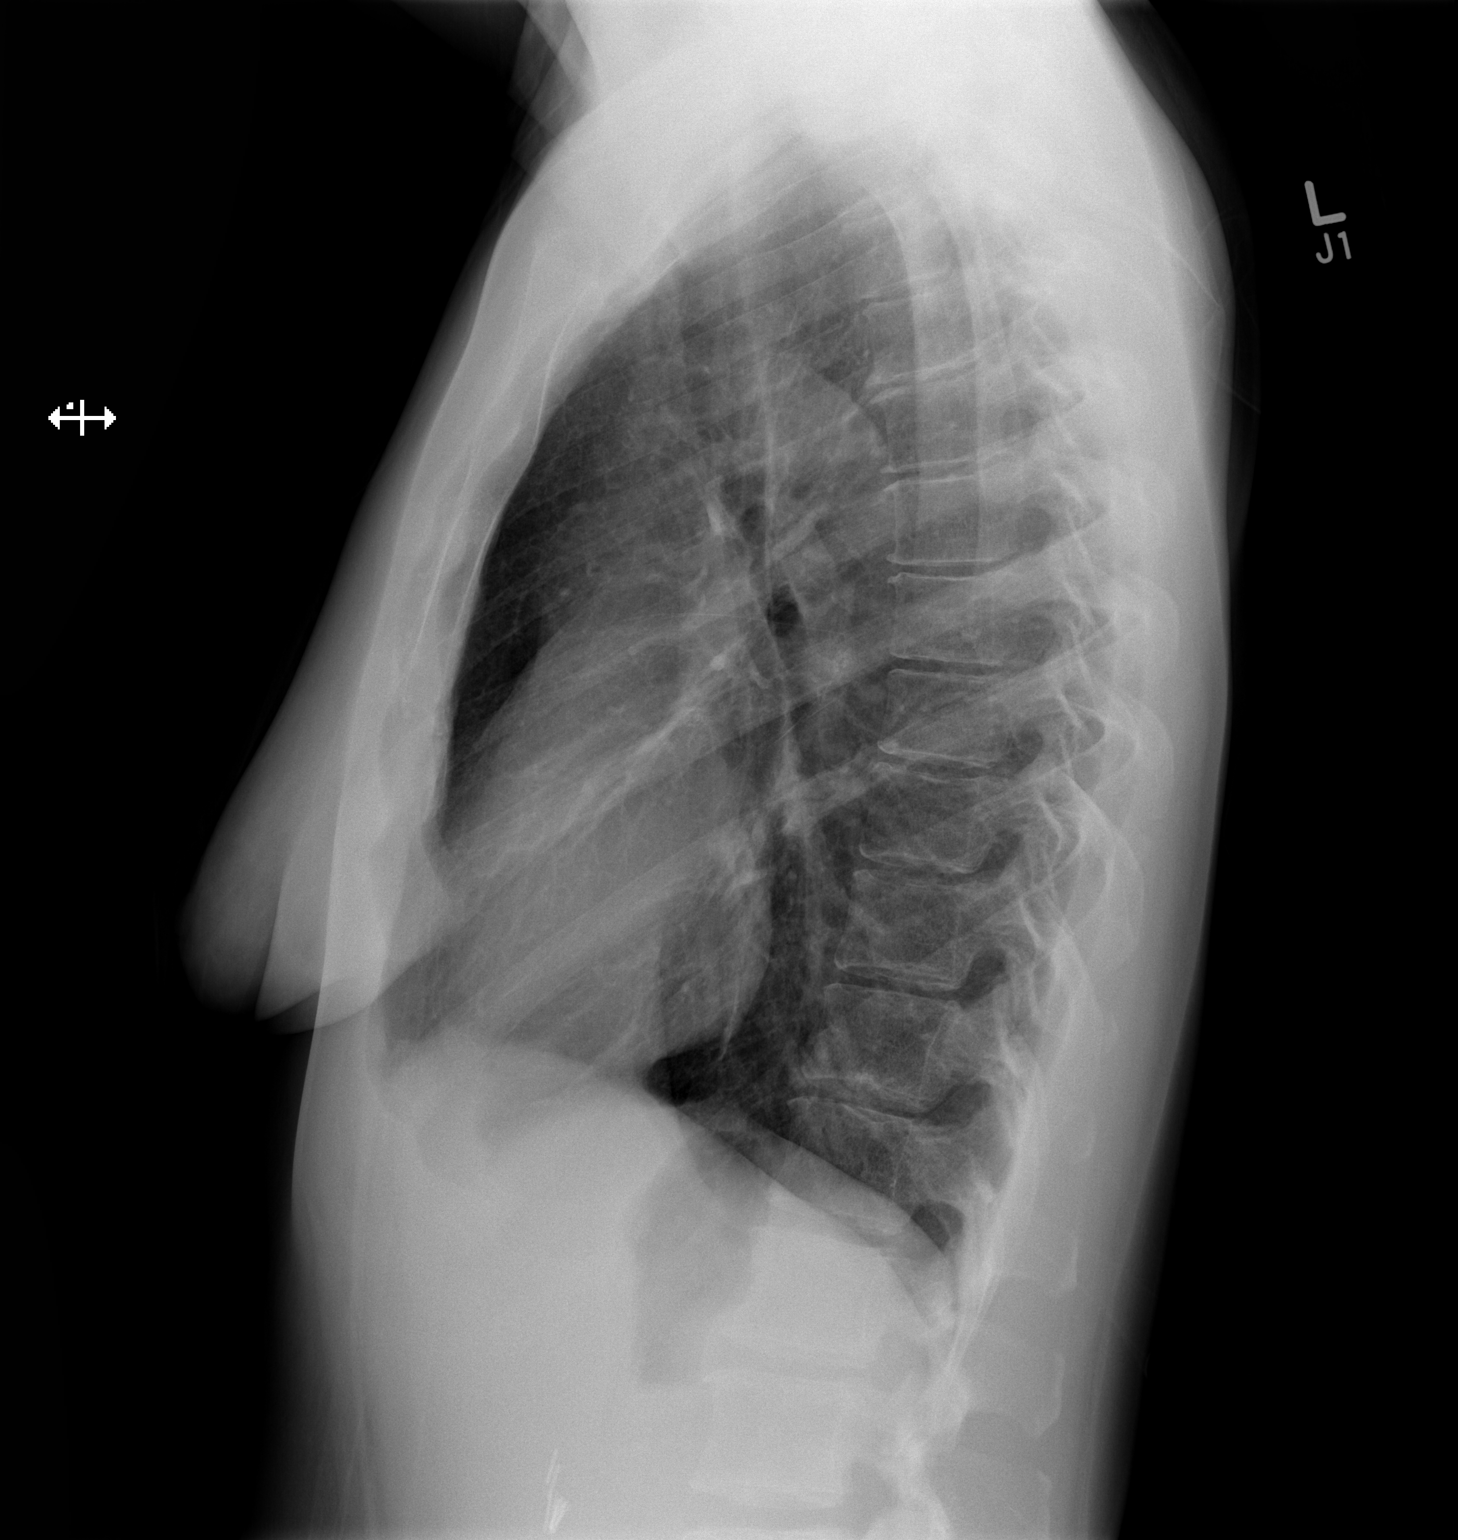

[2 of 2 positions shown; findings below may reference images not displayed]

FINDINGS: The heart size and mediastinal contours are within normal limits.
Both lungs are clear. The visualized skeletal structures are
unremarkable.
IMPRESSION: No active cardiopulmonary disease.

## 2019-05-20 DIAGNOSIS — J029 Acute pharyngitis, unspecified: Secondary | ICD-10-CM | POA: Diagnosis not present

## 2019-05-20 DIAGNOSIS — R05 Cough: Secondary | ICD-10-CM | POA: Diagnosis not present

## 2019-05-20 DIAGNOSIS — Z20828 Contact with and (suspected) exposure to other viral communicable diseases: Secondary | ICD-10-CM | POA: Diagnosis not present

## 2019-05-20 DIAGNOSIS — R6883 Chills (without fever): Secondary | ICD-10-CM | POA: Diagnosis not present

## 2020-10-17 DIAGNOSIS — K529 Noninfective gastroenteritis and colitis, unspecified: Secondary | ICD-10-CM | POA: Diagnosis not present

## 2020-10-17 DIAGNOSIS — R109 Unspecified abdominal pain: Secondary | ICD-10-CM | POA: Diagnosis not present

## 2020-10-17 DIAGNOSIS — R1110 Vomiting, unspecified: Secondary | ICD-10-CM | POA: Diagnosis not present

## 2020-10-26 ENCOUNTER — Ambulatory Visit (INDEPENDENT_AMBULATORY_CARE_PROVIDER_SITE_OTHER): Payer: BC Managed Care – PPO | Admitting: Nurse Practitioner

## 2020-10-26 ENCOUNTER — Encounter: Payer: Self-pay | Admitting: Nurse Practitioner

## 2020-10-26 ENCOUNTER — Other Ambulatory Visit: Payer: Self-pay

## 2020-10-26 VITALS — BP 186/92 | HR 78 | Temp 97.8°F | Resp 16 | Ht 69.0 in | Wt 155.0 lb

## 2020-10-26 DIAGNOSIS — K219 Gastro-esophageal reflux disease without esophagitis: Secondary | ICD-10-CM | POA: Diagnosis not present

## 2020-10-26 DIAGNOSIS — F17218 Nicotine dependence, cigarettes, with other nicotine-induced disorders: Secondary | ICD-10-CM

## 2020-10-26 DIAGNOSIS — I700 Atherosclerosis of aorta: Secondary | ICD-10-CM | POA: Diagnosis not present

## 2020-10-26 DIAGNOSIS — Z6822 Body mass index (BMI) 22.0-22.9, adult: Secondary | ICD-10-CM

## 2020-10-26 DIAGNOSIS — I10 Essential (primary) hypertension: Secondary | ICD-10-CM

## 2020-10-26 DIAGNOSIS — M65342 Trigger finger, left ring finger: Secondary | ICD-10-CM | POA: Diagnosis not present

## 2020-10-26 DIAGNOSIS — Z7689 Persons encountering health services in other specified circumstances: Secondary | ICD-10-CM

## 2020-10-26 DIAGNOSIS — Z833 Family history of diabetes mellitus: Secondary | ICD-10-CM

## 2020-10-26 DIAGNOSIS — Z8632 Personal history of gestational diabetes: Secondary | ICD-10-CM

## 2020-10-26 NOTE — Patient Instructions (Addendum)
Begin Lisinopril 10 mg daily  Notify office immediately of any side effects (dry, hacking cough) Follow up in 2 weeks for BP medicine and to discuss cholesterol medication Return Tuesday, May 31st to check lipid panel, fasting (we open at 7:30) Recommend eye exam, mammogram, and screening colonoscopy Consider quitting smoking We will call you with lab results and appt to orthopedic doctor for trigger finger  Trigger Finger  Trigger finger, also called stenosing tenosynovitis,  is a condition that causes a finger to get stuck in a bent position. Each finger has a tendon, which is a tough, cord-like tissue that connects muscle to bone, and each tendon passes through a tunnel of tissue called a tendon sheath. To move your finger, your tendon needs to glide freely through the sheath. Trigger finger happens when the tendon or the sheath thickens, making it difficult to move your finger. Trigger finger can affect any finger or a thumb. It may affect more than one finger. Mild cases may clear up with rest and medicine. Severe cases require more treatment. What are the causes? Trigger finger is caused by a thickened finger tendon or tendon sheath. The cause of this thickening is not known. What increases the risk? The following factors may make you more likely to develop this condition:  Doing activities that require a strong grip.  Having rheumatoid arthritis, gout, or diabetes.  Being 52-21 years old.  Being female. What are the signs or symptoms? Symptoms of this condition include:  Pain when bending or straightening your finger.  Tenderness or swelling where your finger attaches to the palm of your hand.  A lump in the palm of your hand or on the inside of your finger.  Hearing a noise like a pop or a snap when you try to straighten your finger.  Feeling a catching or locking sensation when you try to straighten your finger.  Being unable to straighten your finger. How is this  diagnosed? This condition is diagnosed based on your symptoms and a physical exam. How is this treated? This condition may be treated by:  Resting your finger and avoiding activities that make symptoms worse.  Wearing a finger splint to keep your finger extended.  Taking NSAIDs, such as ibuprofen, to relieve pain and swelling.  Doing gentle exercises to stretch the finger as told by your health care provider.  Having medicine that reduces swelling and inflammation (steroids) injected into the tendon sheath. Injections may need to be repeated.  Having surgery to open the tendon sheath. This may be done if other treatments do not work and you cannot straighten your finger. You may need physical therapy after surgery. Follow these instructions at home: If you have a splint:  Wear the splint as told by your health care provider. Remove it only as told by your health care provider.  Loosen it if your fingers tingle, become numb, or turn cold and blue.  Keep it clean.  If the splint is not waterproof: ? Do not let it get wet. ? Cover it with a watertight covering when you take a bath or shower. Managing pain, stiffness, and swelling If directed, apply heat to the affected area as often as told by your health care provider. Use the heat source that your health care provider recommends, such as a moist heat pack or a heating pad.  Place a towel between your skin and the heat source.  Leave the heat on for 20-30 minutes.  Remove the heat if your  skin turns bright red. This is especially important if you are unable to feel pain, heat, or cold. You may have a greater risk of getting burned. If directed, put ice on the painful area. To do this:  If you have a removable splint, remove it as told by your health care provider.  Put ice in a plastic bag.  Place a towel between your skin and the bag or between your splint and the bag.  Leave the ice on for 20 minutes, 2-3 times a day.       Activity  Rest your finger as told by your health care provider. Avoid activities that make the pain worse.  Return to your normal activities as told by your health care provider. Ask your health care provider what activities are safe for you.  Do exercises as told by your health care provider.  Ask your health care provider when it is safe to drive if you have a splint on your hand. General instructions  Take over-the-counter and prescription medicines only as told by your health care provider.  Keep all follow-up visits as told by your health care provider. This is important. Contact a health care provider if:  Your symptoms are not improving with home care. Summary  Trigger finger, also called stenosing tenosynovitis, causes your finger to get stuck in a bent position. This can make it difficult and painful to straighten your finger.  This condition develops when a finger tendon or tendon sheath thickens.  Treatment may include resting your finger, wearing a splint, and taking medicines.  In severe cases, surgery to open the tendon sheath may be needed. This information is not intended to replace advice given to you by your health care provider. Make sure you discuss any questions you have with your health care provider. Document Revised: 10/15/2018 Document Reviewed: 10/15/2018 Elsevier Patient Education  2021 [place].  Atherosclerosis  Atherosclerosis is narrowing and hardening of the arteries. Arteries are blood vessels that carry blood from the heart to all parts of the body. This blood contains oxygen. Arteries can become narrow or blocked from inflammation or from a buildup of fat, cholesterol, calcium, and other substances (plaque). Plaque decreases the amount of blood that can flow through the artery. Atherosclerosis can affect any artery in your body, including:  Heart arteries. Damage to these arteries may lead to coronary artery disease, which can cause a  heart attack.  Brain arteries. Damage to these arteries may cause a stroke.  Leg, arm, and pelvis arteries. Peripheral artery disease (PAD) may result from damage to these arteries.  Kidney arteries. Kidney (renal) failure may result from damage to kidney arteries. Treatment may slow the disease and prevent further damage to your heart, brain, peripheral arteries, and kidneys. What are the causes? This condition develops slowly over many years. The inner layers of your arteries become damaged and allow the gradual buildup of plaque. The exact cause of atherosclerosis is not fully understood. Symptoms of atherosclerosis do not occur until an artery becomes narrow or blocked. What increases the risk? The following factors may make you more likely to develop this condition:  Being middle-aged or older.  Certain medical conditions, including: ? High blood pressure. ? High cholesterol. ? High blood fats (triglycerides). ? Diabetes. ? Sleep apnea.  A family history of atherosclerosis.  Being overweight.  Using products that contain tobacco or nicotine.  Not exercising enough (sedentary lifestyle).  Having a substance in your blood called C-reactive protein (CRP). This  is a sign of increased levels of inflammation in your body.  Being stressed.  Drinking too much alcohol or using drugs, such as cocaine or methamphetamine. What are the signs or symptoms? Symptoms of atherosclerosis may not be obvious until there is damage to an area of your body that is not getting enough blood. Sometimes, atherosclerosis does not cause symptoms. Symptoms of this condition include:  Coronary artery disease. This may cause chest pain and shortness of breath.  Decreased blood supply to your brain, which may cause a stroke. Signs of a stroke may include sudden: ? Weakness or numbness in your face, arm, or leg, especially on one side of your body. ? Trouble walking or difficulty moving your arms or  legs. ? Loss of balance or coordination. ? Confusion. ? Slurred speech (dysarthria). ? Trouble speaking, or trouble understanding speech, or both (aphasia). ? Vision changes in one or both eyes. This may be double vision, blurred vision, or loss of vision. ? Severe headache with no known cause. The headache is often described as the worst headache ever experienced.  PAD, which may cause pain and numbness, often in your legs and hips.  Renal failure. This may cause tiredness, problems with urination, swelling, and itchy skin. How is this diagnosed? This condition is diagnosed based on your medical history and a physical exam. During the exam, your health care provider will:  Check your pulse in different places.  Listen for a "whooshing" sound over your arteries (bruit). You may also have tests, such as:  Blood tests to check your levels of cholesterol, triglycerides, and CRP.  Electrocardiogram (ECG) to check for heart damage.  Chest X-ray to see if you have an enlarged heart, which is a sign of heart failure.  Stress test to see how your heart reacts to exercise.  Echocardiogram to get images of the inside of your heart.  Ankle-brachial index to compare blood pressure in your arms to blood pressure in your ankles.  Ultrasound of your peripheral arteries to check blood flow.  CT scan to check for damage to your heart or brain.  X-rays of blood vessels after dye has been injected (angiogram) to check blood flow. How is this treated? This condition is treated with lifestyle changes as the first step. These may include:  Changing your diet.  Losing weight.  Reducing stress.  Exercising and being physically active more regularly.  Quitting smoking. You may also need medicine to:  Lower triglycerides and cholesterol.  Control blood pressure.  Prevent blood clots.  Lower inflammation in your body.  Control your blood sugar. Sometimes, surgery is needed  to:  Remove plaque from an artery (endarterectomy).  Open or widen a narrowed heart artery (angioplasty).  Create a new path for your blood with one of these procedures: ? Heart (coronary) artery bypass graft surgery. ? Peripheral artery bypass graft surgery. Follow these instructions at home: Eating and drinking  Eat a heart-healthy diet. Talk with your health care provider or a diet and nutrition specialist (dietitian) if you need help. A heart-healthy diet involves: ? Limiting unhealthy fats and increasing healthy fats. Some examples of healthy fats are olive oil and canola oil. ? Eating plant-based foods, such as fruits, vegetables, nuts, whole grains, and legumes (such as peas and lentils).  If you drink alcohol: ? Limit how much you use to:  0-1 drink a day for nonpregnant women.  0-2 drinks a day for men. ? Be aware of how much alcohol is in  your drink. In the [place] one drink equals one 12 oz bottle of beer (355 mL), one 5 oz glass of wine (148 mL), or one 1 oz glass of hard liquor (44 mL).   Lifestyle  Maintain a healthy weight. Lose weight if your health care provider says that you need to do that.  Follow an exercise program as told by your health care provider.  Do not use any products that contain nicotine or tobacco, such as cigarettes, e-cigarettes, and chewing tobacco. If you need help quitting, ask your health care provider.  Do not use drugs.   General instructions  Take over-the-counter and prescription medicines only as told by your health care provider.  Manage other health conditions as told.  Keep all follow-up visits as told by your health care provider. This is important. Contact a health care provider if you have:  An irregular heartbeat.  Unexplained fatigue.  Trouble urinating, or you are producing less urine or foamy urine.  Swelling of your hands or feet, or itchy skin.  Unexplained pain or numbness in your legs or hips. Get help right  away if:  You have any symptoms of a heart attack. These may be: ? Chest pain. This includes squeezing chest pain that may feel like indigestion (angina). ? Shortness of breath. ? Pain in your neck, jaw, arms, back, or stomach. ? Cold sweat. ? Nausea. ? Light-headedness.  You have any symptoms of a stroke. "BE FAST" is an easy way to remember the main warning signs of a stroke: ? B - Balance. Signs are dizziness, sudden trouble walking, or loss of balance. ? E - Eyes. Signs are trouble seeing or a sudden change in vision. ? F - Face. Signs are sudden weakness or numbness of the face, or the face or eyelid drooping on one side. ? A - Arms. Signs are weakness or numbness in an arm. This happens suddenly and usually on one side of the body. ? S - Speech. Signs are sudden trouble speaking, slurred speech, or trouble understanding what people say. ? T - Time. Time to call emergency services. Write down what time symptoms started.  You have other signs of a stroke, such as: ? A sudden, severe headache with no known cause. ? Nausea or vomiting. ? Seizure. These symptoms may represent a serious problem that is an emergency. Do not wait to see if the symptoms will go away. Get medical help right away. Call your local emergency services (911 in the [place] Do not drive yourself to the hospital. Summary  Atherosclerosis is narrowing and hardening of the arteries.  Arteries can become narrow from inflammation or from a buildup of fat, cholesterol, calcium, and other substances (plaque).  This condition may not cause any symptoms. If you do have symptoms, they are caused by damage to an area of your body that is not getting enough blood.  Treatment starts with lifestyle changes and may include medicines. In some cases, surgery is needed.  Get help right away if you have any symptoms of a heart attack or stroke. This information is not intended to replace advice given to you by your health care  provider. Make sure you discuss any questions you have with your health care provider. Document Revised: 04/08/2019 Document Reviewed: 04/08/2019 Elsevier Patient Education  2021 [place]. [email or web address]">  DASH Eating Plan DASH stands for Dietary Approaches to Stop Hypertension. The DASH eating plan is a healthy eating plan that has been shown to:  Reduce  high blood pressure (hypertension).  Reduce your risk for type 2 diabetes, heart disease, and stroke.  Help with weight loss. What are tips for following this plan? Reading food labels  Check food labels for the amount of salt (sodium) per serving. Choose foods with less than 5 percent of the Daily Value of sodium. Generally, foods with less than 300 milligrams (mg) of sodium per serving fit into this eating plan.  To find whole grains, look for the word "whole" as the first word in the ingredient list. Shopping  Buy products labeled as "low-sodium" or "no salt added."  Buy fresh foods. Avoid canned foods and pre-made or frozen meals. Cooking  Avoid adding salt when cooking. Use salt-free seasonings or herbs instead of table salt or sea salt. Check with your health care provider or pharmacist before using salt substitutes.  Do not fry foods. Cook foods using healthy methods such as baking, boiling, grilling, roasting, and broiling instead.  Cook with heart-healthy oils, such as olive, canola, avocado, soybean, or sunflower oil. Meal planning  Eat a balanced diet that includes: ? 4 or more servings of fruits and 4 or more servings of vegetables each day. Try to fill one-half of your plate with fruits and vegetables. ? 6-8 servings of whole grains each day. ? Less than 6 oz (170 g) of lean meat, poultry, or fish each day. A 3-oz (85-g) serving of meat is about the same size as a deck of cards. One egg equals 1 oz (28 g). ? 2-3 servings of low-fat dairy each day. One serving is  1 cup (237 mL). ? 1 serving of nuts, seeds, or beans 5 times each week. ? 2-3 servings of heart-healthy fats. Healthy fats called omega-3 fatty acids are found in foods such as walnuts, flaxseeds, fortified milks, and eggs. These fats are also found in cold-water fish, such as sardines, salmon, and mackerel.  Limit how much you eat of: ? Canned or prepackaged foods. ? Food that is high in trans fat, such as some fried foods. ? Food that is high in saturated fat, such as fatty meat. ? Desserts and other sweets, sugary drinks, and other foods with added sugar. ? Full-fat dairy products.  Do not salt foods before eating.  Do not eat more than 4 egg yolks a week.  Try to eat at least 2 vegetarian meals a week.  Eat more home-cooked food and less restaurant, buffet, and fast food.   Lifestyle  When eating at a restaurant, ask that your food be prepared with less salt or no salt, if possible.  If you drink alcohol: ? Limit how much you use to:  0-1 drink a day for women who are not pregnant.  0-2 drinks a day for men. ? Be aware of how much alcohol is in your drink. In the [place] one drink equals one 12 oz bottle of beer (355 mL), one 5 oz glass of wine (148 mL), or one 1 oz glass of hard liquor (44 mL). General information  Avoid eating more than 2,300 mg of salt a day. If you have hypertension, you may need to reduce your sodium intake to 1,500 mg a day.  Work with your health care provider to maintain a healthy body weight or to lose weight. Ask what an ideal weight is for you.  Get at least 30 minutes of exercise that causes your heart to beat faster (aerobic exercise) most days of the week. Activities may include walking, swimming, or  biking.  Work with your health care provider or dietitian to adjust your eating plan to your individual calorie needs. What foods should I eat? Fruits All fresh, dried, or frozen fruit. Canned fruit in natural juice (without added  sugar). Vegetables Fresh or frozen vegetables (raw, steamed, roasted, or grilled). Low-sodium or reduced-sodium tomato and vegetable juice. Low-sodium or reduced-sodium tomato sauce and tomato paste. Low-sodium or reduced-sodium canned vegetables. Grains Whole-grain or whole-wheat bread. Whole-grain or whole-wheat pasta. Brown rice. [place]. Bulgur. Whole-grain and low-sodium cereals. Pita bread. Low-fat, low-sodium crackers. Whole-wheat flour tortillas. Meats and other proteins Skinless chicken or [place]. Ground chicken or [place]. Pork with fat trimmed off. Fish and seafood. Egg whites. Dried beans, peas, or lentils. Unsalted nuts, nut butters, and seeds. Unsalted canned beans. Lean cuts of beef with fat trimmed off. Low-sodium, lean precooked or cured meat, such as sausages or meat loaves. Dairy Low-fat (1%) or fat-free (skim) milk. Reduced-fat, low-fat, or fat-free cheeses. Nonfat, low-sodium ricotta or cottage cheese. Low-fat or nonfat yogurt. Low-fat, low-sodium cheese. Fats and oils Soft margarine without trans fats. Vegetable oil. Reduced-fat, low-fat, or light mayonnaise and salad dressings (reduced-sodium). Canola, safflower, olive, avocado, soybean, and sunflower oils. Avocado. Seasonings and condiments Herbs. Spices. Seasoning mixes without salt. Other foods Unsalted popcorn and pretzels. Fat-free sweets. The items listed above may not be a complete list of foods and beverages you can eat. Contact a dietitian for more information. What foods should I avoid? Fruits Canned fruit in a light or heavy syrup. Fried fruit. Fruit in cream or butter sauce. Vegetables Creamed or fried vegetables. Vegetables in a cheese sauce. Regular canned vegetables (not low-sodium or reduced-sodium). Regular canned tomato sauce and paste (not low-sodium or reduced-sodium). Regular tomato and vegetable juice (not low-sodium or reduced-sodium). Angie Fava. Olives. Grains Baked goods made with fat, such  as croissants, muffins, or some breads. Dry pasta or rice meal packs. Meats and other proteins Fatty cuts of meat. Ribs. Fried meat. Berniece Salines. Bologna, salami, and other precooked or cured meats, such as sausages or meat loaves. Fat from the back of a pig (fatback). Bratwurst. Salted nuts and seeds. Canned beans with added salt. Canned or smoked fish. Whole eggs or egg yolks. Chicken or [place] with skin. Dairy Whole or 2% milk, cream, and half-and-half. Whole or full-fat cream cheese. Whole-fat or sweetened yogurt. Full-fat cheese. Nondairy creamers. Whipped toppings. Processed cheese and cheese spreads. Fats and oils Butter. Stick margarine. Lard. Shortening. Ghee. Bacon fat. Tropical oils, such as coconut, palm kernel, or palm oil. Seasonings and condiments Onion salt, garlic salt, seasoned salt, table salt, and sea salt. Worcestershire sauce. Tartar sauce. Barbecue sauce. Teriyaki sauce. Soy sauce, including reduced-sodium. Steak sauce. Canned and packaged gravies. Fish sauce. Oyster sauce. Cocktail sauce. Store-bought horseradish. Ketchup. Mustard. Meat flavorings and tenderizers. Bouillon cubes. Hot sauces. Pre-made or packaged marinades. Pre-made or packaged taco seasonings. Relishes. Regular salad dressings. Other foods Salted popcorn and pretzels. The items listed above may not be a complete list of foods and beverages you should avoid. Contact a dietitian for more information. Where to find more information  National Heart, Lung, and Blood Institute: https://wilson-eaton.com/  American Heart Association: www.heart.org  Academy of Nutrition and Dietetics: www.eatright.[place]: www.kidney.org Summary  The DASH eating plan is a healthy eating plan that has been shown to reduce high blood pressure (hypertension). It may also reduce your risk for type 2 diabetes, heart disease, and stroke.  When on the DASH eating plan, aim to eat  more fresh fruits and vegetables, whole  grains, lean proteins, low-fat dairy, and heart-healthy fats.  With the DASH eating plan, you should limit salt (sodium) intake to 2,300 mg a day. If you have hypertension, you may need to reduce your sodium intake to 1,500 mg a day.  Work with your health care provider or dietitian to adjust your eating plan to your individual calorie needs. This information is not intended to replace advice given to you by your health care provider. Make sure you discuss any questions you have with your health care provider. Document Revised: 05/03/2019 Document Reviewed: 05/03/2019 Elsevier Patient Education  2021 [place]. Managing Your Hypertension Hypertension, also called high blood pressure, is when the force of the blood pressing against the walls of the arteries is too strong. Arteries are blood vessels that carry blood from your heart throughout your body. Hypertension forces the heart to work harder to pump blood and may cause the arteries to become narrow or stiff. Understanding blood pressure readings Your personal target blood pressure may vary depending on your medical conditions, your age, and other factors. A blood pressure reading includes a higher number over a lower number. Ideally, your blood pressure should be below 120/80. You should know that:  The first, or top, number is called the systolic pressure. It is a measure of the pressure in your arteries as your heart beats.  The second, or bottom number, is called the diastolic pressure. It is a measure of the pressure in your arteries as the heart relaxes. Blood pressure is classified into four stages. Based on your blood pressure reading, your health care provider may use the following stages to determine what type of treatment you need, if any. Systolic pressure and diastolic pressure are measured in a unit called mmHg. Normal  Systolic pressure: below 937.  Diastolic pressure: below 80. Elevated  Systolic pressure:  342-876.  Diastolic pressure: below 80. Hypertension stage 1  Systolic pressure: 811-572.  Diastolic pressure: 62-03. Hypertension stage 2  Systolic pressure: 559 or above.  Diastolic pressure: 90 or above. How can this condition affect me? Managing your hypertension is an important responsibility. Over time, hypertension can damage the arteries and decrease blood flow to important parts of the body, including the brain, heart, and kidneys. Having untreated or uncontrolled hypertension can lead to:  A heart attack.  A stroke.  A weakened blood vessel (aneurysm).  Heart failure.  Kidney damage.  Eye damage.  Metabolic syndrome.  Memory and concentration problems.  Vascular dementia. What actions can I take to manage this condition? Hypertension can be managed by making lifestyle changes and possibly by taking medicines. Your health care provider will help you make a plan to bring your blood pressure within a normal range. Nutrition  Eat a diet that is high in fiber and potassium, and low in salt (sodium), added sugar, and fat. An example eating plan is called the Dietary Approaches to Stop Hypertension (DASH) diet. To eat this way: ? Eat plenty of fresh fruits and vegetables. Try to fill one-half of your plate at each meal with fruits and vegetables. ? Eat whole grains, such as whole-wheat pasta, brown rice, or whole-grain bread. Fill about one-fourth of your plate with whole grains. ? Eat low-fat dairy products. ? Avoid fatty cuts of meat, processed or cured meats, and poultry with skin. Fill about one-fourth of your plate with lean proteins such as fish, chicken without skin, beans, eggs, and tofu. ? Avoid pre-made and processed  foods. These tend to be higher in sodium, added sugar, and fat.  Reduce your daily sodium intake. Most people with hypertension should eat less than 1,500 mg of sodium a day.   Lifestyle  Work with your health care provider to maintain a  healthy body weight or to lose weight. Ask what an ideal weight is for you.  Get at least 30 minutes of exercise that causes your heart to beat faster (aerobic exercise) most days of the week. Activities may include walking, swimming, or biking.  Include exercise to strengthen your muscles (resistance exercise), such as weight lifting, as part of your weekly exercise routine. Try to do these types of exercises for 30 minutes at least 3 days a week.  Do not use any products that contain nicotine or tobacco, such as cigarettes, e-cigarettes, and chewing tobacco. If you need help quitting, ask your health care provider.  Control any long-term (chronic) conditions you have, such as high cholesterol or diabetes.  Identify your sources of stress and find ways to manage stress. This may include meditation, deep breathing, or making time for fun activities.   Alcohol use  Do not drink alcohol if: ? Your health care provider tells you not to drink. ? You are pregnant, may be pregnant, or are planning to become pregnant.  If you drink alcohol: ? Limit how much you use to:  0-1 drink a day for women.  0-2 drinks a day for men. ? Be aware of how much alcohol is in your drink. In the [place] one drink equals one 12 oz bottle of beer (355 mL), one 5 oz glass of wine (148 mL), or one 1 oz glass of hard liquor (44 mL). Medicines Your health care provider may prescribe medicine if lifestyle changes are not enough to get your blood pressure under control and if:  Your systolic blood pressure is 130 or higher.  Your diastolic blood pressure is 80 or higher. Take medicines only as told by your health care provider. Follow the directions carefully. Blood pressure medicines must be taken as told by your health care provider. The medicine does not work as well when you skip doses. Skipping doses also puts you at risk for problems. Monitoring Before you monitor your blood pressure:  Do not smoke, drink  caffeinated beverages, or exercise within 30 minutes before taking a measurement.  Use the bathroom and empty your bladder (urinate).  Sit quietly for at least 5 minutes before taking measurements. Monitor your blood pressure at home as told by your health care provider. To do this:  Sit with your back straight and supported.  Place your feet flat on the floor. Do not cross your legs.  Support your arm on a flat surface, such as a table. Make sure your upper arm is at heart level.  Each time you measure, take two or three readings one minute apart and record the results. You may also need to have your blood pressure checked regularly by your health care provider.   General information  Talk with your health care provider about your diet, exercise habits, and other lifestyle factors that may be contributing to hypertension.  Review all the medicines you take with your health care provider because there may be side effects or interactions.  Keep all visits as told by your health care provider. Your health care provider can help you create and adjust your plan for managing your high blood pressure. Where to find more information  National Heart,  Lung, and Blood Institute: https://wilson-eaton.com/  American Heart Association: www.heart.org Contact a health care provider if:  You think you are having a reaction to medicines you have taken.  You have repeated (recurrent) headaches.  You feel dizzy.  You have swelling in your ankles.  You have trouble with your vision. Get help right away if:  You develop a severe headache or confusion.  You have unusual weakness or numbness, or you feel faint.  You have severe pain in your chest or abdomen.  You vomit repeatedly.  You have trouble breathing. These symptoms may represent a serious problem that is an emergency. Do not wait to see if the symptoms will go away. Get medical help right away. Call your local emergency services (911 in  the [place] Do not drive yourself to the hospital. Summary  Hypertension is when the force of blood pumping through your arteries is too strong. If this condition is not controlled, it may put you at risk for serious complications.  Your personal target blood pressure may vary depending on your medical conditions, your age, and other factors. For most people, a normal blood pressure is less than 120/80.  Hypertension is managed by lifestyle changes, medicines, or both.  Lifestyle changes to help manage hypertension include losing weight, eating a healthy, low-sodium diet, exercising more, stopping smoking, and limiting alcohol. This information is not intended to replace advice given to you by your health care provider. Make sure you discuss any questions you have with your health care provider. Document Revised: 07/05/2019 Document Reviewed: 04/30/2019 Elsevier Patient Education  2021 [place]. Blood Pressure Record Sheet To take your blood pressure, you will need a blood pressure machine. You can buy a blood pressure machine (blood pressure monitor) at your clinic, drug store, or online. When choosing one, consider:  An automatic monitor that has an arm cuff.  A cuff that wraps snugly around your upper arm. You should be able to fit only one finger between your arm and the cuff.  A device that stores blood pressure reading results.  Do not choose a monitor that measures your blood pressure from your wrist or finger. Follow your health care provider's instructions for how to take your blood pressure. To use this form:  Get one reading in the morning (a.m.) before you take any medicines.  Get one reading in the evening (p.m.) before supper.  Take at least 2 readings with each blood pressure check. This makes sure the results are correct. Wait 1-2 minutes between measurements.  Write down the results in the spaces on this form.  Repeat this once a week, or as told by your health  care provider.  Make a follow-up appointment with your health care provider to discuss the results. Blood pressure log Date: _______________________  a.m. _____________________(1st reading) _____________________(2nd reading)  p.m. _____________________(1st reading) _____________________(2nd reading) Date: _______________________  a.m. _____________________(1st reading) _____________________(2nd reading)  p.m. _____________________(1st reading) _____________________(2nd reading) Date: _______________________  a.m. _____________________(1st reading) _____________________(2nd reading)  p.m. _____________________(1st reading) _____________________(2nd reading) Date: _______________________  a.m. _____________________(1st reading) _____________________(2nd reading)  p.m. _____________________(1st reading) _____________________(2nd reading) Date: _______________________  a.m. _____________________(1st reading) _____________________(2nd reading)  p.m. _____________________(1st reading) _____________________(2nd reading) This information is not intended to replace advice given to you by your health care provider. Make sure you discuss any questions you have with your health care provider. Document Revised: 09/18/2019 Document Reviewed: 09/18/2019 Elsevier Patient Education  2021 [employer].
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
## 2020-10-27 LAB — CBC WITH DIFFERENTIAL/PLATELET
Basophils Absolute: 0.1 10*3/uL (ref 0.0–0.2)
Basos: 2 %
EOS (ABSOLUTE): 0.2 10*3/uL (ref 0.0–0.4)
Eos: 3 %
Hematocrit: 44 % (ref 34.0–46.6)
Hemoglobin: 14.9 g/dL (ref 11.1–15.9)
Immature Grans (Abs): 0 10*3/uL (ref 0.0–0.1)
Immature Granulocytes: 0 %
Lymphocytes Absolute: 2.9 10*3/uL (ref 0.7–3.1)
Lymphs: 39 %
MCH: 31.1 pg (ref 26.6–33.0)
MCHC: 33.9 g/dL (ref 31.5–35.7)
MCV: 92 fL (ref 79–97)
Monocytes Absolute: 0.6 10*3/uL (ref 0.1–0.9)
Monocytes: 8 %
Neutrophils Absolute: 3.6 10*3/uL (ref 1.4–7.0)
Neutrophils: 48 %
Platelets: 219 10*3/uL (ref 150–450)
RBC: 4.79 x10E6/uL (ref 3.77–5.28)
RDW: 12.5 % (ref 11.7–15.4)
WBC: 7.3 10*3/uL (ref 3.4–10.8)

## 2020-10-27 LAB — COMPREHENSIVE METABOLIC PANEL
ALT: 14 IU/L (ref 0–32)
AST: 16 IU/L (ref 0–40)
Albumin/Globulin Ratio: 2.3 — ABNORMAL HIGH (ref 1.2–2.2)
Albumin: 4.9 g/dL (ref 3.8–4.9)
Alkaline Phosphatase: 51 IU/L (ref 44–121)
BUN/Creatinine Ratio: 12 (ref 9–23)
BUN: 10 mg/dL (ref 6–24)
Bilirubin Total: 0.2 mg/dL (ref 0.0–1.2)
CO2: 26 mmol/L (ref 20–29)
Calcium: 9.9 mg/dL (ref 8.7–10.2)
Chloride: 100 mmol/L (ref 96–106)
Creatinine, Ser: 0.86 mg/dL (ref 0.57–1.00)
Globulin, Total: 2.1 g/dL (ref 1.5–4.5)
Glucose: 82 mg/dL (ref 65–99)
Potassium: 4.1 mmol/L (ref 3.5–5.2)
Sodium: 141 mmol/L (ref 134–144)
Total Protein: 7 g/dL (ref 6.0–8.5)
eGFR: 82 mL/min/{1.73_m2} (ref >59)

## 2020-10-27 LAB — TSH: TSH: 2.96 u[IU]/mL (ref 0.450–4.500)

## 2020-10-27 MED ORDER — LISINOPRIL 10 MG PO TABS: 10.0000 mg | ORAL_TABLET | Freq: Every day | ORAL | 3 refills | Status: DC

## 2020-11-06 ENCOUNTER — Telehealth: Payer: Self-pay

## 2020-11-06 NOTE — Telephone Encounter (Signed)
I tried to contact the patient before the upcoming scheduled appointment for 11/10/2020. I left a message asking the pt to call the office back before the appointment. Just waiting for the patient to call back.  The phone call is regarding the balance on the account.  CURRENT BALANCE FOR [facility]:  EMDS:-none  EPIC: $311.03-deductible-DOS 10/26/2020

## 2020-11-10 ENCOUNTER — Other Ambulatory Visit: Payer: Self-pay

## 2020-11-10 ENCOUNTER — Other Ambulatory Visit: Payer: BC Managed Care – PPO

## 2020-11-10 ENCOUNTER — Encounter: Payer: Self-pay | Admitting: Nurse Practitioner

## 2020-11-10 ENCOUNTER — Ambulatory Visit (INDEPENDENT_AMBULATORY_CARE_PROVIDER_SITE_OTHER): Payer: BC Managed Care – PPO | Admitting: Nurse Practitioner

## 2020-11-10 VITALS — BP 142/90 | HR 62 | Temp 97.9°F | Ht 69.0 in | Wt 152.0 lb

## 2020-11-10 DIAGNOSIS — I700 Atherosclerosis of aorta: Secondary | ICD-10-CM | POA: Diagnosis not present

## 2020-11-10 DIAGNOSIS — F17218 Nicotine dependence, cigarettes, with other nicotine-induced disorders: Secondary | ICD-10-CM

## 2020-11-10 DIAGNOSIS — I10 Essential (primary) hypertension: Secondary | ICD-10-CM | POA: Diagnosis not present

## 2020-11-10 MED ORDER — LISINOPRIL 20 MG PO TABS: 20.0000 mg | ORAL_TABLET | Freq: Every day | ORAL | 1 refills | Status: DC

## 2020-11-10 NOTE — Patient Instructions (Signed)
Increase Lisinopril to 20 mg daily  Monitor BP at home Notify office immediately of any side effects Return in 4-weeks, early am for BP recheck   [email or web address]">  DASH Eating Plan DASH stands for Dietary Approaches to Stop Hypertension. The DASH eating plan is a healthy eating plan that has been shown to:  Reduce high blood pressure (hypertension).  Reduce your risk for type 2 diabetes, heart disease, and stroke.  Help with weight loss. What are tips for following this plan? Reading food labels  Check food labels for the amount of salt (sodium) per serving. Choose foods with less than 5 percent of the Daily Value of sodium. Generally, foods with less than 300 milligrams (mg) of sodium per serving fit into this eating plan.  To find whole grains, look for the word "whole" as the first word in the ingredient list. Shopping  Buy products labeled as "low-sodium" or "no salt added."  Buy fresh foods. Avoid canned foods and pre-made or frozen meals. Cooking  Avoid adding salt when cooking. Use salt-free seasonings or herbs instead of table salt or sea salt. Check with your health care provider or pharmacist before using salt substitutes.  Do not fry foods. Cook foods using healthy methods such as baking, boiling, grilling, roasting, and broiling instead.  Cook with heart-healthy oils, such as olive, canola, avocado, soybean, or sunflower oil. Meal planning  Eat a balanced diet that includes: ? 4 or more servings of fruits and 4 or more servings of vegetables each day. Try to fill one-half of your plate with fruits and vegetables. ? 6-8 servings of whole grains each day. ? Less than 6 oz (170 g) of lean meat, poultry, or fish each day. A 3-oz (85-g) serving of meat is about the same size as a deck of cards. One egg equals 1 oz (28 g). ? 2-3 servings of low-fat dairy each day. One serving is 1 cup (237 mL). ? 1 serving of nuts, seeds,  or beans 5 times each week. ? 2-3 servings of heart-healthy fats. Healthy fats called omega-3 fatty acids are found in foods such as walnuts, flaxseeds, fortified milks, and eggs. These fats are also found in cold-water fish, such as sardines, salmon, and mackerel.  Limit how much you eat of: ? Canned or prepackaged foods. ? Food that is high in trans fat, such as some fried foods. ? Food that is high in saturated fat, such as fatty meat. ? Desserts and other sweets, sugary drinks, and other foods with added sugar. ? Full-fat dairy products.  Do not salt foods before eating.  Do not eat more than 4 egg yolks a week.  Try to eat at least 2 vegetarian meals a week.  Eat more home-cooked food and less restaurant, buffet, and fast food.   Lifestyle  When eating at a restaurant, ask that your food be prepared with less salt or no salt, if possible.  If you drink alcohol: ? Limit how much you use to:  0-1 drink a day for women who are not pregnant.  0-2 drinks a day for men. ? Be aware of how much alcohol is in your drink. In the [place] one drink equals one 12 oz bottle of beer (355 mL), one 5 oz glass of wine (148 mL), or one 1 oz glass of hard liquor (44 mL). General information  Avoid eating more than 2,300 mg of salt a day. If you have hypertension, you may need to reduce your sodium intake to  1,500 mg a day.  Work with your health care provider to maintain a healthy body weight or to lose weight. Ask what an ideal weight is for you.  Get at least 30 minutes of exercise that causes your heart to beat faster (aerobic exercise) most days of the week. Activities may include walking, swimming, or biking.  Work with your health care provider or dietitian to adjust your eating plan to your individual calorie needs. What foods should I eat? Fruits All fresh, dried, or frozen fruit. Canned fruit in natural juice (without added sugar). Vegetables Fresh or frozen vegetables (raw,  steamed, roasted, or grilled). Low-sodium or reduced-sodium tomato and vegetable juice. Low-sodium or reduced-sodium tomato sauce and tomato paste. Low-sodium or reduced-sodium canned vegetables. Grains Whole-grain or whole-wheat bread. Whole-grain or whole-wheat pasta. Brown rice. [place]. Bulgur. Whole-grain and low-sodium cereals. Pita bread. Low-fat, low-sodium crackers. Whole-wheat flour tortillas. Meats and other proteins Skinless chicken or [place]. Ground chicken or [place]. Pork with fat trimmed off. Fish and seafood. Egg whites. Dried beans, peas, or lentils. Unsalted nuts, nut butters, and seeds. Unsalted canned beans. Lean cuts of beef with fat trimmed off. Low-sodium, lean precooked or cured meat, such as sausages or meat loaves. Dairy Low-fat (1%) or fat-free (skim) milk. Reduced-fat, low-fat, or fat-free cheeses. Nonfat, low-sodium ricotta or cottage cheese. Low-fat or nonfat yogurt. Low-fat, low-sodium cheese. Fats and oils Soft margarine without trans fats. Vegetable oil. Reduced-fat, low-fat, or light mayonnaise and salad dressings (reduced-sodium). Canola, safflower, olive, avocado, soybean, and sunflower oils. Avocado. Seasonings and condiments Herbs. Spices. Seasoning mixes without salt. Other foods Unsalted popcorn and pretzels. Fat-free sweets. The items listed above may not be a complete list of foods and beverages you can eat. Contact a dietitian for more information. What foods should I avoid? Fruits Canned fruit in a light or heavy syrup. Fried fruit. Fruit in cream or butter sauce. Vegetables Creamed or fried vegetables. Vegetables in a cheese sauce. Regular canned vegetables (not low-sodium or reduced-sodium). Regular canned tomato sauce and paste (not low-sodium or reduced-sodium). Regular tomato and vegetable juice (not low-sodium or reduced-sodium). Amanda Benton. Olives. Grains Baked goods made with fat, such as croissants, muffins, or some breads. Dry pasta or  rice meal packs. Meats and other proteins Fatty cuts of meat. Ribs. Fried meat. Berniece Salines. Bologna, salami, and other precooked or cured meats, such as sausages or meat loaves. Fat from the back of a pig (fatback). Bratwurst. Salted nuts and seeds. Canned beans with added salt. Canned or smoked fish. Whole eggs or egg yolks. Chicken or [place] with skin. Dairy Whole or 2% milk, cream, and half-and-half. Whole or full-fat cream cheese. Whole-fat or sweetened yogurt. Full-fat cheese. Nondairy creamers. Whipped toppings. Processed cheese and cheese spreads. Fats and oils Butter. Stick margarine. Lard. Shortening. Ghee. Bacon fat. Tropical oils, such as coconut, palm kernel, or palm oil. Seasonings and condiments Onion salt, garlic salt, seasoned salt, table salt, and sea salt. Worcestershire sauce. Tartar sauce. Barbecue sauce. Teriyaki sauce. Soy sauce, including reduced-sodium. Steak sauce. Canned and packaged gravies. Fish sauce. Oyster sauce. Cocktail sauce. Store-bought horseradish. Ketchup. Mustard. Meat flavorings and tenderizers. Bouillon cubes. Hot sauces. Pre-made or packaged marinades. Pre-made or packaged taco seasonings. Relishes. Regular salad dressings. Other foods Salted popcorn and pretzels. The items listed above may not be a complete list of foods and beverages you should avoid. Contact a dietitian for more information. Where to find more information  National Heart, Lung, and Blood Institute: https://wilson-eaton.com/  American Heart Association: www.heart.org  Academy of Nutrition and Dietetics: www.eatright.[place]: www.kidney.org Summary  The DASH eating plan is a healthy eating plan that has been shown to reduce high blood pressure (hypertension). It may also reduce your risk for type 2 diabetes, heart disease, and stroke.  When on the DASH eating plan, aim to eat more fresh fruits and vegetables, whole grains, lean proteins, low-fat dairy, and heart-healthy  fats.  With the DASH eating plan, you should limit salt (sodium) intake to 2,300 mg a day. If you have hypertension, you may need to reduce your sodium intake to 1,500 mg a day.  Work with your health care provider or dietitian to adjust your eating plan to your individual calorie needs. This information is not intended to replace advice given to you by your health care provider. Make sure you discuss any questions you have with your health care provider. Document Revised: 05/03/2019 Document Reviewed: 05/03/2019 Elsevier Patient Education  2021 [place]. Managing Your Hypertension Hypertension, also called high blood pressure, is when the force of the blood pressing against the walls of the arteries is too strong. Arteries are blood vessels that carry blood from your heart throughout your body. Hypertension forces the heart to work harder to pump blood and may cause the arteries to become narrow or stiff. Understanding blood pressure readings Your personal target blood pressure may vary depending on your medical conditions, your age, and other factors. A blood pressure reading includes a higher number over a lower number. Ideally, your blood pressure should be below 120/80. You should know that:  The first, or top, number is called the systolic pressure. It is a measure of the pressure in your arteries as your heart beats.  The second, or bottom number, is called the diastolic pressure. It is a measure of the pressure in your arteries as the heart relaxes. Blood pressure is classified into four stages. Based on your blood pressure reading, your health care provider may use the following stages to determine what type of treatment you need, if any. Systolic pressure and diastolic pressure are measured in a unit called mmHg. Normal  Systolic pressure: below 607.  Diastolic pressure: below 80. Elevated  Systolic pressure: 371-062.  Diastolic pressure: below 80. Hypertension stage  1  Systolic pressure: 694-854.  Diastolic pressure: 62-70. Hypertension stage 2  Systolic pressure: 350 or above.  Diastolic pressure: 90 or above. How can this condition affect me? Managing your hypertension is an important responsibility. Over time, hypertension can damage the arteries and decrease blood flow to important parts of the body, including the brain, heart, and kidneys. Having untreated or uncontrolled hypertension can lead to:  A heart attack.  A stroke.  A weakened blood vessel (aneurysm).  Heart failure.  Kidney damage.  Eye damage.  Metabolic syndrome.  Memory and concentration problems.  Vascular dementia. What actions can I take to manage this condition? Hypertension can be managed by making lifestyle changes and possibly by taking medicines. Your health care provider will help you make a plan to bring your blood pressure within a normal range. Nutrition  Eat a diet that is high in fiber and potassium, and low in salt (sodium), added sugar, and fat. An example eating plan is called the Dietary Approaches to Stop Hypertension (DASH) diet. To eat this way: ? Eat plenty of fresh fruits and vegetables. Try to fill one-half of your plate at each meal with fruits and vegetables. ? Eat whole grains, such as whole-wheat pasta, brown  rice, or whole-grain bread. Fill about one-fourth of your plate with whole grains. ? Eat low-fat dairy products. ? Avoid fatty cuts of meat, processed or cured meats, and poultry with skin. Fill about one-fourth of your plate with lean proteins such as fish, chicken without skin, beans, eggs, and tofu. ? Avoid pre-made and processed foods. These tend to be higher in sodium, added sugar, and fat.  Reduce your daily sodium intake. Most people with hypertension should eat less than 1,500 mg of sodium a day.   Lifestyle  Work with your health care provider to maintain a healthy body weight or to lose weight. Ask what an ideal weight is  for you.  Get at least 30 minutes of exercise that causes your heart to beat faster (aerobic exercise) most days of the week. Activities may include walking, swimming, or biking.  Include exercise to strengthen your muscles (resistance exercise), such as weight lifting, as part of your weekly exercise routine. Try to do these types of exercises for 30 minutes at least 3 days a week.  Do not use any products that contain nicotine or tobacco, such as cigarettes, e-cigarettes, and chewing tobacco. If you need help quitting, ask your health care provider.  Control any long-term (chronic) conditions you have, such as high cholesterol or diabetes.  Identify your sources of stress and find ways to manage stress. This may include meditation, deep breathing, or making time for fun activities.   Alcohol use  Do not drink alcohol if: ? Your health care provider tells you not to drink. ? You are pregnant, may be pregnant, or are planning to become pregnant.  If you drink alcohol: ? Limit how much you use to:  0-1 drink a day for women.  0-2 drinks a day for men. ? Be aware of how much alcohol is in your drink. In the [place] one drink equals one 12 oz bottle of beer (355 mL), one 5 oz glass of wine (148 mL), or one 1 oz glass of hard liquor (44 mL). Medicines Your health care provider may prescribe medicine if lifestyle changes are not enough to get your blood pressure under control and if:  Your systolic blood pressure is 130 or higher.  Your diastolic blood pressure is 80 or higher. Take medicines only as told by your health care provider. Follow the directions carefully. Blood pressure medicines must be taken as told by your health care provider. The medicine does not work as well when you skip doses. Skipping doses also puts you at risk for problems. Monitoring Before you monitor your blood pressure:  Do not smoke, drink caffeinated beverages, or exercise within 30 minutes before taking a  measurement.  Use the bathroom and empty your bladder (urinate).  Sit quietly for at least 5 minutes before taking measurements. Monitor your blood pressure at home as told by your health care provider. To do this:  Sit with your back straight and supported.  Place your feet flat on the floor. Do not cross your legs.  Support your arm on a flat surface, such as a table. Make sure your upper arm is at heart level.  Each time you measure, take two or three readings one minute apart and record the results. You may also need to have your blood pressure checked regularly by your health care provider.   General information  Talk with your health care provider about your diet, exercise habits, and other lifestyle factors that may be contributing to hypertension.  Review all the medicines you take with your health care provider because there may be side effects or interactions.  Keep all visits as told by your health care provider. Your health care provider can help you create and adjust your plan for managing your high blood pressure. Where to find more information  National Heart, Lung, and Blood Institute: https://wilson-eaton.com/  American Heart Association: www.heart.org Contact a health care provider if:  You think you are having a reaction to medicines you have taken.  You have repeated (recurrent) headaches.  You feel dizzy.  You have swelling in your ankles.  You have trouble with your vision. Get help right away if:  You develop a severe headache or confusion.  You have unusual weakness or numbness, or you feel faint.  You have severe pain in your chest or abdomen.  You vomit repeatedly.  You have trouble breathing. These symptoms may represent a serious problem that is an emergency. Do not wait to see if the symptoms will go away. Get medical help right away. Call your local emergency services (911 in the [place] Do not drive yourself to the  hospital. Summary  Hypertension is when the force of blood pumping through your arteries is too strong. If this condition is not controlled, it may put you at risk for serious complications.  Your personal target blood pressure may vary depending on your medical conditions, your age, and other factors. For most people, a normal blood pressure is less than 120/80.  Hypertension is managed by lifestyle changes, medicines, or both.  Lifestyle changes to help manage hypertension include losing weight, eating a healthy, low-sodium diet, exercising more, stopping smoking, and limiting alcohol. This information is not intended to replace advice given to you by your health care provider. Make sure you discuss any questions you have with your health care provider. Document Revised: 07/05/2019 Document Reviewed: 04/30/2019 Elsevier Patient Education  2021 [place]. Blood Pressure Record Sheet To take your blood pressure, you will need a blood pressure machine. You can buy a blood pressure machine (blood pressure monitor) at your clinic, drug store, or online. When choosing one, consider:  An automatic monitor that has an arm cuff.  A cuff that wraps snugly around your upper arm. You should be able to fit only one finger between your arm and the cuff.  A device that stores blood pressure reading results.  Do not choose a monitor that measures your blood pressure from your wrist or finger. Follow your health care provider's instructions for how to take your blood pressure. To use this form:  Get one reading in the morning (a.m.) before you take any medicines.  Get one reading in the evening (p.m.) before supper.  Take at least 2 readings with each blood pressure check. This makes sure the results are correct. Wait 1-2 minutes between measurements.  Write down the results in the spaces on this form.  Repeat this once a week, or as told by your health care provider.  Make a follow-up  appointment with your health care provider to discuss the results. Blood pressure log Date: _______________________  a.m. _____________________(1st reading) _____________________(2nd reading)  p.m. _____________________(1st reading) _____________________(2nd reading) Date: _______________________  a.m. _____________________(1st reading) _____________________(2nd reading)  p.m. _____________________(1st reading) _____________________(2nd reading) Date: _______________________  a.m. _____________________(1st reading) _____________________(2nd reading)  p.m. _____________________(1st reading) _____________________(2nd reading) Date: _______________________  a.m. _____________________(1st reading) _____________________(2nd reading)  p.m. _____________________(1st reading) _____________________(2nd reading) Date: _______________________  a.m. _____________________(1st reading) _____________________(2nd reading)  p.m. _____________________(1st reading)  _____________________(2nd reading) This information is not intended to replace advice given to you by your health care provider. Make sure you discuss any questions you have with your health care provider. Document Revised: 09/18/2019 Document Reviewed: 09/18/2019 Elsevier Patient Education  2021 [employer].
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
## 2020-11-10 NOTE — Telephone Encounter (Signed)
Pt notified of EPIC balance for DOS 10/26/2020. Pt paid $50 on her balance today. Pt was given [facility] Billing information. Pt notified if she is not signed up for a payment plan balance will have to be paid in full before she can be seen at her next apt for DOS 10/26/2020

## 2020-11-11 ENCOUNTER — Other Ambulatory Visit: Payer: Self-pay | Admitting: Nurse Practitioner

## 2020-11-11 DIAGNOSIS — E782 Mixed hyperlipidemia: Secondary | ICD-10-CM

## 2020-11-11 DIAGNOSIS — I700 Atherosclerosis of aorta: Secondary | ICD-10-CM

## 2020-11-11 LAB — LIPID PANEL
Chol/HDL Ratio: 4.8 ratio — ABNORMAL HIGH (ref 0.0–4.4)
Cholesterol, Total: 258 mg/dL — ABNORMAL HIGH (ref 100–199)
HDL: 54 mg/dL (ref >39)
LDL Chol Calc (NIH): 170 mg/dL — ABNORMAL HIGH (ref 0–99)
Triglycerides: 187 mg/dL — ABNORMAL HIGH (ref 0–149)
VLDL Cholesterol Cal: 34 mg/dL (ref 5–40)

## 2020-11-11 LAB — CARDIOVASCULAR RISK ASSESSMENT

## 2020-11-11 MED ORDER — ROSUVASTATIN CALCIUM 20 MG PO TABS: 20.0000 mg | ORAL_TABLET | Freq: Every day | ORAL | 3 refills | Status: DC

## 2020-11-13 ENCOUNTER — Other Ambulatory Visit: Payer: Self-pay

## 2020-11-19 DIAGNOSIS — R202 Paresthesia of skin: Secondary | ICD-10-CM | POA: Diagnosis not present

## 2020-11-19 DIAGNOSIS — R0602 Shortness of breath: Secondary | ICD-10-CM | POA: Diagnosis not present

## 2020-11-19 DIAGNOSIS — R0789 Other chest pain: Secondary | ICD-10-CM | POA: Diagnosis not present

## 2020-12-07 ENCOUNTER — Encounter: Payer: Self-pay | Admitting: Nurse Practitioner

## 2020-12-07 ENCOUNTER — Other Ambulatory Visit: Payer: Self-pay

## 2020-12-07 ENCOUNTER — Ambulatory Visit (INDEPENDENT_AMBULATORY_CARE_PROVIDER_SITE_OTHER): Payer: BC Managed Care – PPO | Admitting: Nurse Practitioner

## 2020-12-07 VITALS — BP 160/88 | HR 65 | Temp 97.6°F | Ht 69.0 in | Wt 153.0 lb

## 2020-12-07 DIAGNOSIS — I10 Essential (primary) hypertension: Secondary | ICD-10-CM

## 2020-12-07 DIAGNOSIS — E782 Mixed hyperlipidemia: Secondary | ICD-10-CM

## 2020-12-07 DIAGNOSIS — Z5320 Procedure and treatment not carried out because of patient's decision for unspecified reasons: Secondary | ICD-10-CM

## 2020-12-07 DIAGNOSIS — K219 Gastro-esophageal reflux disease without esophagitis: Secondary | ICD-10-CM

## 2020-12-07 DIAGNOSIS — F17218 Nicotine dependence, cigarettes, with other nicotine-induced disorders: Secondary | ICD-10-CM

## 2020-12-07 DIAGNOSIS — I700 Atherosclerosis of aorta: Secondary | ICD-10-CM

## 2020-12-07 NOTE — Patient Instructions (Addendum)
Continue heart healthy diet Continue physical activity Recommend smoking cessation Follow-up 95-months, fasting  Managing the Challenge of Quitting Smoking Quitting smoking is a physical and mental challenge. You will face cravings, withdrawal symptoms, and temptation. Before quitting, work with your health care provider to make a plan that can help you manage quitting. Preparation canhelp you quit and keep you from giving in. How to manage lifestyle changes Managing stress Stress can make you want to smoke, and wanting to smoke may cause stress. It is important to find ways to manage your stress. You might try some of the following: Practice relaxation techniques. Breathe slowly and deeply, in through your nose and out through your mouth. Listen to music. Soak in a bath or take a shower. Imagine a peaceful place or vacation. Get some support. Talk with family or friends about your stress. Join a support group. Talk with a counselor or therapist. Get some physical activity. Go for a walk, run, or bike ride. Play a favorite sport. Practice yoga.  Medicines Talk with your health care provider about medicines that might help you dealwith cravings and make quitting easier for you. Relationships Social situations can be difficult when you are quitting smoking. To manage this, you can: Avoid parties and other social situations where people might be smoking. Avoid alcohol. Leave right away if you have the urge to smoke. Explain to your family and friends that you are quitting smoking. Ask for support and let them know you might be a bit grumpy. Plan activities where smoking is not an option. General instructions Be aware that many people gain weight after they quit smoking. However, not everyone does. To keep from gaining weight, have a plan in place before you quit and stick to the plan after you quit. Your plan should include: Having healthy snacks. When you have a craving, it may help  to: Eat popcorn, carrots, celery, or other cut vegetables. Chew sugar-free gum. Changing how you eat. Eat small portion sizes at meals. Eat 4-6 small meals throughout the day instead of 1-2 large meals a day. Be mindful when you eat. Do not watch television or do other things that might distract you as you eat. Exercising regularly. Make time to exercise each day. If you do not have time for a long workout, do short bouts of exercise for 5-10 minutes several times a day. Do some form of strengthening exercise, such as weight lifting. Do some exercise that gets your heart beating and causes you to breathe deeply, such as walking fast, running, swimming, or biking. This is very important. Drinking plenty of water or other low-calorie or no-calorie drinks. Drink 6-8 glasses of water daily.  How to recognize withdrawal symptoms Your body and mind may experience discomfort as you try to get used to not having nicotine in your system. These effects are called withdrawal symptoms. They may include: Feeling hungrier than normal. Having trouble concentrating. Feeling irritable or restless. Having trouble sleeping. Feeling depressed. Craving a cigarette. To manage withdrawal symptoms: Avoid places, people, and activities that trigger your cravings. Remember why you want to quit. Get plenty of sleep. Avoid coffee and other caffeinated drinks. These may worsen some of your symptoms. These symptoms may surprise you. But be assured that they are normal to havewhen quitting smoking. How to manage cravings Come up with a plan for how to deal with your cravings. The plan should include the following: A definition of the specific situation you want to deal with. An alternative  action you will take. A clear idea for how this action will help. The name of someone who might help you with this. Cravings usually last for 5-10 minutes. Consider taking the following actions to help you with your plan to deal  with cravings: Keep your mouth busy. Chew sugar-free gum. Suck on hard candies or a straw. Brush your teeth. Keep your hands and body busy. Change to a different activity right away. Squeeze or play with a ball. Do an activity or a hobby, such as making bead jewelry, practicing needlepoint, or working with wood. Mix up your normal routine. Take a short exercise break. Go for a quick walk or run up and down stairs. Focus on doing something kind or helpful for someone else. Call a friend or family member to talk during a craving. Join a support group. Contact a quitline. Where to find support To get help or find a support group: Call the [facility]'s Smoking Quitline: 1-800-QUIT NOW (334)375-9282) Visit the website of the Substance Abuse and [place]: ktimeonline.com Text QUIT to SmokefreeTXT: 026378 Where to find more information Visit these websites to find more information on quitting smoking: [place]: www.smokefree.gov American Lung Association: www.lung.org American Cancer Society: www.cancer.org Centers for Disease Control and Prevention: http://www.wolf.info/ American Heart Association: www.heart.org Contact a health care provider if: You want to change your plan for quitting. The medicines you are taking are not helping. Your eating feels out of control or you cannot sleep. Get help right away if: You feel depressed or become very anxious. Summary Quitting smoking is a physical and mental challenge. You will face cravings, withdrawal symptoms, and temptation to smoke again. Preparation can help you as you go through these challenges. Try different techniques to manage stress, handle social situations, and prevent weight gain. You can deal with cravings by keeping your mouth busy (such as by chewing gum), keeping your hands and body busy, calling family or friends, or contacting a quitline for people who want to quit  smoking. You can deal with withdrawal symptoms by avoiding places where people smoke, getting plenty of rest, and avoiding drinks with caffeine. This information is not intended to replace advice given to you by your health care provider. Make sure you discuss any questions you have with your healthcare provider. Document Revised: 03/19/2019 Document Reviewed: 03/19/2019 Elsevier Patient Education  2022 [place] Your Hypertension Hypertension, also called high blood pressure, is when the force of the blood pressing against the walls of the arteries is too strong. Arteries are blood vessels that carry blood from your heart throughout your body. Hypertension forces the heart to work harder to pump blood and may cause the arteries tobecome narrow or stiff. Understanding blood pressure readings Your personal target blood pressure may vary depending on your medical conditions, your age, and other factors. A blood pressure reading includes a higher number over a lower number. Ideally, your blood pressure should be below 120/80. You should know that: The first, or top, number is called the systolic pressure. It is a measure of the pressure in your arteries as your heart beats. The second, or bottom number, is called the diastolic pressure. It is a measure of the pressure in your arteries as the heart relaxes. Blood pressure is classified into four stages. Based on your blood pressure reading, your health care provider may use the following stages to determine what type of treatment you need, if any. Systolic pressure and diastolicpressure are  measured in a unit called mmHg. Normal Systolic pressure: below 601. Diastolic pressure: below 80. Elevated Systolic pressure: 093-235. Diastolic pressure: below 80. Hypertension stage 1 Systolic pressure: 573-220. Diastolic pressure: 25-42. Hypertension stage 2 Systolic pressure: 706 or above. Diastolic pressure: 90 or above. How can this  condition affect me? Managing your hypertension is an important responsibility. Over time, hypertension can damage the arteries and decrease blood flow to important parts of the body, including the brain, heart, and kidneys. Having untreated or uncontrolled hypertension can lead to: A heart attack. A stroke. A weakened blood vessel (aneurysm). Heart failure. Kidney damage. Eye damage. Metabolic syndrome. Memory and concentration problems. Vascular dementia. What actions can I take to manage this condition? Hypertension can be managed by making lifestyle changes and possibly by taking medicines. Your health care provider will help you make a plan to bring yourblood pressure within a normal range. Nutrition  Eat a diet that is high in fiber and potassium, and low in salt (sodium), added sugar, and fat. An example eating plan is called the Dietary Approaches to Stop Hypertension (DASH) diet. To eat this way: Eat plenty of fresh fruits and vegetables. Try to fill one-half of your plate at each meal with fruits and vegetables. Eat whole grains, such as whole-wheat pasta, brown rice, or whole-grain bread. Fill about one-fourth of your plate with whole grains. Eat low-fat dairy products. Avoid fatty cuts of meat, processed or cured meats, and poultry with skin. Fill about one-fourth of your plate with lean proteins such as fish, chicken without skin, beans, eggs, and tofu. Avoid pre-made and processed foods. These tend to be higher in sodium, added sugar, and fat. Reduce your daily sodium intake. Most people with hypertension should eat less than 1,500 mg of sodium a day.  Lifestyle  Work with your health care provider to maintain a healthy body weight or to lose weight. Ask what an ideal weight is for you. Get at least 30 minutes of exercise that causes your heart to beat faster (aerobic exercise) most days of the week. Activities may include walking, swimming, or biking. Include exercise to  strengthen your muscles (resistance exercise), such as weight lifting, as part of your weekly exercise routine. Try to do these types of exercises for 30 minutes at least 3 days a week. Do not use any products that contain nicotine or tobacco, such as cigarettes, e-cigarettes, and chewing tobacco. If you need help quitting, ask your health care provider. Control any long-term (chronic) conditions you have, such as high cholesterol or diabetes. Identify your sources of stress and find ways to manage stress. This may include meditation, deep breathing, or making time for fun activities.  Alcohol use Do not drink alcohol if: Your health care provider tells you not to drink. You are pregnant, may be pregnant, or are planning to become pregnant. If you drink alcohol: Limit how much you use to: 0-1 drink a day for women. 0-2 drinks a day for men. Be aware of how much alcohol is in your drink. In the [place] one drink equals one 12 oz bottle of beer (355 mL), one 5 oz glass of wine (148 mL), or one 1 oz glass of hard liquor (44 mL). Medicines Your health care provider may prescribe medicine if lifestyle changes are not enough to get your blood pressure under control and if: Your systolic blood pressure is 130 or higher. Your diastolic blood pressure is 80 or higher. Take medicines only as told by your health  care provider. Follow the directions carefully. Blood pressure medicines must be taken as told by your health care provider. The medicine does not work as well when you skip doses. Skippingdoses also puts you at risk for problems. Monitoring Before you monitor your blood pressure: Do not smoke, drink caffeinated beverages, or exercise within 30 minutes before taking a measurement. Use the bathroom and empty your bladder (urinate). Sit quietly for at least 5 minutes before taking measurements. Monitor your blood pressure at home as told by your health care provider. To do this: Sit with your back  straight and supported. Place your feet flat on the floor. Do not cross your legs. Support your arm on a flat surface, such as a table. Make sure your upper arm is at heart level. Each time you measure, take two or three readings one minute apart and record the results. You may also need to have your blood pressure checked regularly by your healthcare provider. General information Talk with your health care provider about your diet, exercise habits, and other lifestyle factors that may be contributing to hypertension. Review all the medicines you take with your health care provider because there may be side effects or interactions. Keep all visits as told by your health care provider. Your health care provider can help you create and adjust your plan for managing your high blood pressure. Where to find more information National Heart, Lung, and Blood Institute: https://wilson-eaton.com/ American Heart Association: www.heart.org Contact a health care provider if: You think you are having a reaction to medicines you have taken. You have repeated (recurrent) headaches. You feel dizzy. You have swelling in your ankles. You have trouble with your vision. Get help right away if: You develop a severe headache or confusion. You have unusual weakness or numbness, or you feel faint. You have severe pain in your chest or abdomen. You vomit repeatedly. You have trouble breathing. These symptoms may represent a serious problem that is an emergency. Do not wait to see if the symptoms will go away. Get medical help right away. Call your local emergency services (911 in the [place] Do not drive yourself to the hospital. Summary Hypertension is when the force of blood pumping through your arteries is too strong. If this condition is not controlled, it may put you at risk for serious complications. Your personal target blood pressure may vary depending on your medical conditions, your age, and other factors. For  most people, a normal blood pressure is less than 120/80. Hypertension is managed by lifestyle changes, medicines, or both. Lifestyle changes to help manage hypertension include losing weight, eating a healthy, low-sodium diet, exercising more, stopping smoking, and limiting alcohol. This information is not intended to replace advice given to you by your health care provider. Make sure you discuss any questions you have with your healthcare provider. Document Revised: 07/05/2019 Document Reviewed: 04/30/2019 Elsevier Patient Education  2022 [place]. Preventing High Cholesterol Cholesterol is a white, waxy substance similar to fat that the human body needs to help build cells. The liver makes all the cholesterol that a person's body needs. Having high cholesterol (hypercholesterolemia) increases your risk for heart disease and stroke. Extra or excess cholesterolcomes from the food that you eat. High cholesterol can often be prevented with diet and lifestyle changes. If you already have high cholesterol, you can control it with diet, lifestyle changes,and medicines. How can high cholesterol affect me? If you have high cholesterol, fatty deposits (plaques) may build up on the walls  of your blood vessels. The blood vessels that carry blood away from your heart are called arteries. Plaques make the arteries narrower and stiffer. This in turn can: Restrict or block blood flow and cause blood clots to form. Increase your risk for heart attack and stroke. What can increase my risk for high cholesterol? This condition is more likely to develop in people who: Eat foods that are high in saturated fat or cholesterol. Saturated fat is mostly found in foods that come from animal sources. Are overweight. Are not getting enough exercise. Have a family history of high cholesterol (familial hypercholesterolemia). What actions can I take to prevent this? Nutrition  Eat less saturated fat. Avoid trans fats  (partially hydrogenated oils). These are often found in margarine and in some baked goods, fried foods, and snacks bought in packages. Avoid precooked or cured meat, such as bacon, sausages, or meat loaves. Avoid foods and drinks that have added sugars. Eat more fruits, vegetables, and whole grains. Choose healthy sources of protein, such as fish, poultry, lean cuts of red meat, beans, peas, lentils, and nuts. Choose healthy sources of fat, such as: Nuts. Vegetable oils, especially olive oil. Fish that have healthy fats, such as omega-3 fatty acids. These fish include mackerel or salmon.  Lifestyle Lose weight if you are overweight. Maintaining a healthy body mass index (BMI) can help prevent or control high cholesterol. It can also lower your risk for diabetes and high blood pressure. Ask your health care provider to help you with a diet and exercise plan to lose weight safely. Do not use any products that contain nicotine or tobacco, such as cigarettes, e-cigarettes, and chewing tobacco. If you need help quitting, ask your health care provider. Alcohol use Do not drink alcohol if: Your health care provider tells you not to drink. You are pregnant, may be pregnant, or are planning to become pregnant. If you drink alcohol: Limit how much you use to: 0-1 drink a day for women. 0-2 drinks a day for men. Be aware of how much alcohol is in your drink. In the [place] one drink equals one 12 oz bottle of beer (355 mL), one 5 oz glass of wine (148 mL), or one 1 oz glass of hard liquor (44 mL). Activity  Get enough exercise. Do exercises as told by your health care provider. Each week, do at least 150 minutes of exercise that takes a medium level of effort (moderate-intensity exercise). This kind of exercise: Makes your heart beat faster while allowing you to still be able to talk. Can be done in short sessions several times a day or longer sessions a few times a week. For example, on 5 days each  week, you could walk fast or ride your bike 3 times a day for 10 minutes each time.  Medicines Your health care provider may recommend medicines to help lower cholesterol. This may be a medicine to lower the amount of cholesterol that your liver makes. You may need medicine if: Diet and lifestyle changes have not lowered your cholesterol enough. You have high cholesterol and other risk factors for heart disease or stroke. Take over-the-counter and prescription medicines only as told by your health care provider. General information Manage your risk factors for high cholesterol. Talk with your health care provider about all your risk factors and how to lower your risk. Manage other conditions that you have, such as diabetes or high blood pressure (hypertension). Have blood tests to check your cholesterol levels at regular  points in time as told by your health care provider. Keep all follow-up visits as told by your health care provider. This is important. Where to find more information American Heart Association: www.heart.org National Heart, Lung, and Blood Institute: https://wilson-eaton.com/ Summary High cholesterol increases your risk for heart disease and stroke. By keeping your cholesterol level low, you can reduce your risk for these conditions. High cholesterol can often be prevented with diet and lifestyle changes. Work with your health care provider to manage your risk factors, and have your blood tested regularly. This information is not intended to replace advice given to you by your health care provider. Make sure you discuss any questions you have with your healthcare provider. Document Revised: 03/12/2019 Document Reviewed: 03/12/2019 Elsevier Patient Education  [place].
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
## 2021-03-08 ENCOUNTER — Ambulatory Visit: Payer: BC Managed Care – PPO | Admitting: Nurse Practitioner

## 2022-03-31 DIAGNOSIS — R079 Chest pain, unspecified: Secondary | ICD-10-CM | POA: Diagnosis not present

## 2022-03-31 DIAGNOSIS — I10 Essential (primary) hypertension: Secondary | ICD-10-CM | POA: Diagnosis not present

## 2022-03-31 DIAGNOSIS — E785 Hyperlipidemia, unspecified: Secondary | ICD-10-CM | POA: Diagnosis not present

## 2022-03-31 DIAGNOSIS — Z720 Tobacco use: Secondary | ICD-10-CM | POA: Diagnosis not present

## 2022-04-06 DIAGNOSIS — K219 Gastro-esophageal reflux disease without esophagitis: Secondary | ICD-10-CM | POA: Insufficient documentation

## 2022-04-06 DIAGNOSIS — I10 Essential (primary) hypertension: Secondary | ICD-10-CM | POA: Insufficient documentation

## 2022-04-06 DIAGNOSIS — G8929 Other chronic pain: Secondary | ICD-10-CM | POA: Insufficient documentation

## 2022-04-07 ENCOUNTER — Encounter: Payer: Self-pay | Admitting: Cardiology

## 2022-04-07 ENCOUNTER — Ambulatory Visit: Payer: BC Managed Care – PPO | Attending: Cardiology

## 2022-04-07 ENCOUNTER — Ambulatory Visit: Payer: BC Managed Care – PPO | Attending: Cardiology | Admitting: Cardiology

## 2022-04-07 VITALS — BP 176/92 | HR 95 | Ht 69.0 in | Wt 149.4 lb

## 2022-04-07 DIAGNOSIS — R002 Palpitations: Secondary | ICD-10-CM

## 2022-04-07 DIAGNOSIS — I10 Essential (primary) hypertension: Secondary | ICD-10-CM

## 2022-04-07 DIAGNOSIS — F17200 Nicotine dependence, unspecified, uncomplicated: Secondary | ICD-10-CM

## 2022-04-07 DIAGNOSIS — I700 Atherosclerosis of aorta: Secondary | ICD-10-CM | POA: Diagnosis not present

## 2022-04-07 DIAGNOSIS — E785 Hyperlipidemia, unspecified: Secondary | ICD-10-CM | POA: Diagnosis not present
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
## 2022-04-07 NOTE — Patient Instructions (Addendum)
Medication Instructions:  Your physician recommends that you continue on your current medications as directed. Please refer to the Current Medication list given to you today.  *If you need a refill on your cardiac medications before your next appointment, please call your pharmacy*   Lab Work: BMP-today If you have labs (blood work) drawn today and your tests are completely normal, you will receive your results only by: [place] (if you have MyChart) OR A paper copy in the mail If you have any lab test that is abnormal or we need to change your treatment, we will call you to review the results.   Testing/Procedures:  WHY IS MY DOCTOR PRESCRIBING ZIO? The Zio system is proven and trusted by physicians to detect and diagnose irregular heart rhythms -- and has been prescribed to hundreds of thousands of patients.  The FDA has cleared the Zio system to monitor for many different kinds of irregular heart rhythms. In a study, physicians were able to reach a diagnosis 90% of the time with the Zio system1.  You can wear the Zio monitor -- a small, discreet, comfortable patch -- during your normal day-to-day activity, including while you sleep, shower, and exercise, while it records every single heartbeat for analysis.  1Barrett, P., et al. Comparison of 24 Hour Holter Monitoring Versus 14 Day Novel Adhesive Patch Electrocardiographic Monitoring. [place], 2014.  ZIO VS. HOLTER MONITORING The Zio monitor can be comfortably worn for up to 14 days. Holter monitors can be worn for 24 to 48 hours, limiting the time to record any irregular heart rhythms you may have. Zio is able to capture data for the 51% of patients who have their first symptom-triggered arrhythmia after 48 hours.1  LIVE WITHOUT RESTRICTIONS The Zio ambulatory cardiac monitor is a small, unobtrusive, and water-resistant patch--you might even forget you're wearing it. The Zio monitor records and stores every  beat of your heart, whether you're sleeping, working out, or showering.     Follow-Up: At [facility], you and your health needs are our priority.  As part of our continuing mission to provide you with exceptional heart care, we have created designated Provider Care Teams.  These Care Teams include your primary Cardiologist (physician) and Advanced Practice Providers (APPs -  Physician Assistants and Nurse Practitioners) who all work together to provide you with the care you need, when you need it.  We recommend signing up for the patient portal called "MyChart".  Sign up information is provided on this After Visit Summary.  MyChart is used to connect with patients for Virtual Visits (Telemedicine).  Patients are able to view lab/test results, encounter notes, upcoming appointments, etc.  Non-urgent messages can be sent to your provider as well.   To learn more about what you can do with MyChart, go to [email or web address].    Your next appointment:   1 month(s)  The format for your next appointment:   In Person  Provider:   Jenne Campus, MD    Other Instructions NA

## 2022-04-08 LAB — BASIC METABOLIC PANEL
BUN/Creatinine Ratio: 24 — ABNORMAL HIGH (ref 9–23)
BUN: 18 mg/dL (ref 6–24)
CO2: 25 mmol/L (ref 20–29)
Calcium: 10.2 mg/dL (ref 8.7–10.2)
Chloride: 100 mmol/L (ref 96–106)
Creatinine, Ser: 0.74 mg/dL (ref 0.57–1.00)
Glucose: 83 mg/dL (ref 70–99)
Potassium: 4.2 mmol/L (ref 3.5–5.2)
Sodium: 142 mmol/L (ref 134–144)
eGFR: 97 mL/min/{1.73_m2} (ref >59)

## 2022-04-12 ENCOUNTER — Telehealth: Payer: Self-pay

## 2022-04-12 NOTE — Telephone Encounter (Signed)
Received disability forms from [employer] for this patient. There is no indication of Dr Agustin Cree authorizing not to work. In fact last note does mention her working. I called and left a message to return my call for more details.

## 2022-04-12 NOTE — Telephone Encounter (Signed)
Pt is returning call. Requesting call back.  

## 2022-04-13 NOTE — Telephone Encounter (Signed)
Patient states she needs forms completed for intermittent leave when has strong episodes of palpitations. I spoke to Dr. Raliegh Ip and advise unable to complete forms at this time awaiting results of Zio monitor. Once information is review he can decide if will need intermittent leave or temporarily leave on the cardiology standpoint. Patient understood.

## 2022-04-19 ENCOUNTER — Telehealth: Payer: Self-pay

## 2022-04-19 DIAGNOSIS — I10 Essential (primary) hypertension: Secondary | ICD-10-CM

## 2022-04-19 MED ORDER — LOSARTAN POTASSIUM 25 MG PO TABS: 25.0000 mg | ORAL_TABLET | Freq: Every day | ORAL | 3 refills | Status: DC

## 2022-04-19 NOTE — Telephone Encounter (Signed)
LVM for pt per DPR- Per Dr. Wendy Poet note. Sent Losartan '25mg'$  to pharmacy.  Routed to PCP

## 2022-04-30 LAB — BASIC METABOLIC PANEL WITH GFR
BUN/Creatinine Ratio: 14 (ref 9–23)
BUN: 10 mg/dL (ref 6–24)
CO2: 26 mmol/L (ref 20–29)
Calcium: 9.8 mg/dL (ref 8.7–10.2)
Chloride: 103 mmol/L (ref 96–106)
Creatinine, Ser: 0.7 mg/dL (ref 0.57–1.00)
Glucose: 83 mg/dL (ref 70–99)
Potassium: 3.7 mmol/L (ref 3.5–5.2)
Sodium: 143 mmol/L (ref 134–144)
eGFR: 104 mL/min/{1.73_m2}

## 2022-05-10 NOTE — Telephone Encounter (Signed)
Message sent to Dr. Raliegh Ip to review results and advise

## 2022-05-13 ENCOUNTER — Ambulatory Visit: Payer: BC Managed Care – PPO | Attending: Cardiology | Admitting: Cardiology

## 2022-05-13 ENCOUNTER — Encounter: Payer: Self-pay | Admitting: Cardiology

## 2022-05-13 VITALS — BP 130/74 | HR 89 | Ht 69.0 in | Wt 150.8 lb

## 2022-05-13 DIAGNOSIS — R002 Palpitations: Secondary | ICD-10-CM

## 2022-05-13 DIAGNOSIS — I10 Essential (primary) hypertension: Secondary | ICD-10-CM

## 2022-05-13 DIAGNOSIS — I700 Atherosclerosis of aorta: Secondary | ICD-10-CM

## 2022-05-13 DIAGNOSIS — R0789 Other chest pain: Secondary | ICD-10-CM | POA: Diagnosis not present

## 2022-05-13 DIAGNOSIS — F17200 Nicotine dependence, unspecified, uncomplicated: Secondary | ICD-10-CM

## 2022-05-13 DIAGNOSIS — E785 Hyperlipidemia, unspecified: Secondary | ICD-10-CM

## 2022-05-13 MED ORDER — METOPROLOL SUCCINATE ER 25 MG PO TB24: 25.0000 mg | ORAL_TABLET | Freq: Every day | ORAL | 3 refills | Status: DC
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
## 2022-05-13 NOTE — Patient Instructions (Signed)
Medication Instructions:  Your physician has recommended you make the following change in your medication:   START:Metoprolol Succinate '25mg'$  1 tablet daily    Lab Work: Your physician recommends that you return for lab work in: 1 month You need to have labs done when you are fasting.  You can come Monday through Friday 8:30 am to 12:00 pm and 1:15 to 4:30. You do not need to make an appointment as the order has already been placed. The labs you are going to have done are AST, ALT and Lipids.    Testing/Procedures: None Ordered   Follow-Up: At [facility] you and your health needs are our priority.  As part of our continuing mission to provide you with exceptional heart care, we have created designated Provider Care Teams.  These Care Teams include your primary Cardiologist (physician) and Advanced Practice Providers (APPs -  Physician Assistants and Nurse Practitioners) who all work together to provide you with the care you need, when you need it.  We recommend signing up for the patient portal called "MyChart".  Sign up information is provided on this After Visit Summary.  MyChart is used to connect with patients for Virtual Visits (Telemedicine).  Patients are able to view lab/test results, encounter notes, upcoming appointments, etc.  Non-urgent messages can be sent to your provider as well.   To learn more about what you can do with MyChart, go to [email or web address].    Your next appointment:   3 month(s)  The format for your next appointment:   In Person  Provider:   Jenne Campus, MD    Other Instructions NA

## 2022-05-27 ENCOUNTER — Telehealth: Payer: Self-pay | Admitting: Cardiology

## 2022-05-27 NOTE — Telephone Encounter (Signed)
Patient states she is returning a call. 

## 2022-05-27 NOTE — Telephone Encounter (Signed)
Spoke with pt. She stated that she is taking the Metoprolol '25mg'$  and not having as may episodes. She wants to stay on the same dose for a while longer and then try the '50mg'$  later if needed

## 2022-06-24 ENCOUNTER — Other Ambulatory Visit: Payer: Self-pay

## 2022-06-24 LAB — LIPID PANEL
Chol/HDL Ratio: 2.6 ratio (ref 0.0–4.4)
Cholesterol, Total: 212 mg/dL — ABNORMAL HIGH (ref 100–199)
HDL: 83 mg/dL (ref >39)
LDL Chol Calc (NIH): 112 mg/dL — ABNORMAL HIGH (ref 0–99)
Triglycerides: 95 mg/dL (ref 0–149)
VLDL Cholesterol Cal: 17 mg/dL (ref 5–40)

## 2022-06-24 NOTE — Addendum Note (Signed)
Addended by: Edwyna Shell I on: 06/24/2022 11:09 AM   Modules accepted: Orders

## 2022-08-05 ENCOUNTER — Telehealth: Payer: Self-pay | Admitting: Cardiology

## 2022-08-05 NOTE — Telephone Encounter (Signed)
Patient informed of results.  

## 2022-08-05 NOTE — Telephone Encounter (Signed)
Patient returning call for lab results. 

## 2022-09-09 ENCOUNTER — Ambulatory Visit: Payer: BC Managed Care – PPO | Attending: Cardiology | Admitting: Cardiology

## 2022-09-09 ENCOUNTER — Encounter: Payer: Self-pay | Admitting: Cardiology

## 2022-09-09 VITALS — BP 128/68 | HR 64 | Ht 69.0 in | Wt 152.6 lb

## 2022-09-09 DIAGNOSIS — E785 Hyperlipidemia, unspecified: Secondary | ICD-10-CM

## 2022-09-09 DIAGNOSIS — I471 Supraventricular tachycardia, unspecified: Secondary | ICD-10-CM

## 2022-09-09 DIAGNOSIS — I10 Essential (primary) hypertension: Secondary | ICD-10-CM | POA: Diagnosis not present

## 2022-09-09 DIAGNOSIS — I700 Atherosclerosis of aorta: Secondary | ICD-10-CM

## 2022-09-09 DIAGNOSIS — R0789 Other chest pain: Secondary | ICD-10-CM | POA: Diagnosis not present

## 2022-09-09 DIAGNOSIS — F17200 Nicotine dependence, unspecified, uncomplicated: Secondary | ICD-10-CM

## 2022-09-09 MED ORDER — METOPROLOL SUCCINATE ER 50 MG PO TB24: 50.0000 mg | ORAL_TABLET | Freq: Every day | ORAL | 3 refills | Status: DC

## 2022-09-09 MED ORDER — NEXLIZET 180-10 MG PO TABS: 1.0000 | ORAL_TABLET | Freq: Every day | ORAL | 0 refills | Status: DC

## 2022-09-09 MED ORDER — NEXLIZET 180-10 MG PO TABS: 1.0000 | ORAL_TABLET | Freq: Every day | ORAL | 6 refills | Status: DC

## 2022-09-09 NOTE — Addendum Note (Signed)
Addended by: Jacobo Forest D on: 09/09/2022 02:26 PM   Modules accepted: Orders

## 2022-09-09 NOTE — Patient Instructions (Signed)
Medication Instructions:   START: Metoprolol Succinate 50mg  1 tablet q day  START: Nexlizet 1 tablet daily   Lab Work: Your physician recommends that you return for lab work in: 6 weeks You need to have labs done when you are fasting.  You can come Monday through Friday 8:30 am to 12:00 pm and 1:15 to 4:30. You do not need to make an appointment as the order has already been placed. The labs you are going to have done are AST, ALT ,Lipids.    Testing/Procedures: None Ordered   Follow-Up: At [facility], you and your health needs are our priority.  As part of our continuing mission to provide you with exceptional heart care, we have created designated Provider Care Teams.  These Care Teams include your primary Cardiologist (physician) and Advanced Practice Providers (APPs -  Physician Assistants and Nurse Practitioners) who all work together to provide you with the care you need, when you need it.  We recommend signing up for the patient portal called "MyChart".  Sign up information is provided on this After Visit Summary.  MyChart is used to connect with patients for Virtual Visits (Telemedicine).  Patients are able to view lab/test results, encounter notes, upcoming appointments, etc.  Non-urgent messages can be sent to your provider as well.   To learn more about what you can do with MyChart, go to [email or web address].    Your next appointment:   3 month(s)  The format for your next appointment:   In Person  Provider:   Jenne Campus, MD    Other Instructions NA

## 2022-09-09 NOTE — Addendum Note (Signed)
Addended by: Darrel Reach on: 09/09/2022 02:23 PM   Modules accepted: Orders
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
## 2022-09-12 ENCOUNTER — Telehealth: Payer: Self-pay | Admitting: Cardiology

## 2022-09-12 NOTE — Telephone Encounter (Signed)
Pt c/o medication issue:  1. Name of Medication:   Bempedoic Acid-Ezetimibe (NEXLIZET) 180-10 MG TABS Take 1 tablet by mouth daily.    2. How are you currently taking this medication (dosage and times per day)?   3. Are you having a reaction (difficulty breathing--STAT)?   4. What is your medication issue? Pt states she took it on Saturday and threw up. She also states her heart has been pounding and she's been nauseated since taking this medication. Please advise.

## 2022-09-14 MED ORDER — EZETIMIBE 10 MG PO TABS: 10.0000 mg | ORAL_TABLET | Freq: Every day | ORAL | 3 refills | Status: AC

## 2022-09-14 NOTE — Addendum Note (Signed)
Addended by: Truddie Hidden on: 09/14/2022 11:48 AM   Modules accepted: Orders

## 2022-09-14 NOTE — Telephone Encounter (Signed)
Recommendations reviewed with pt as per Dr. Krasowski's note.  Pt verbalized understanding and had no additional questions.  

## 2022-09-14 NOTE — Telephone Encounter (Signed)
Left vm to call back

## 2022-12-09 ENCOUNTER — Ambulatory Visit: Payer: BC Managed Care – PPO | Attending: Cardiology | Admitting: Cardiology

## 2022-12-09 ENCOUNTER — Encounter: Payer: Self-pay | Admitting: Cardiology

## 2022-12-09 VITALS — BP 122/66 | HR 61 | Ht 70.0 in | Wt 148.6 lb

## 2022-12-09 DIAGNOSIS — F17200 Nicotine dependence, unspecified, uncomplicated: Secondary | ICD-10-CM | POA: Diagnosis not present

## 2022-12-09 DIAGNOSIS — I700 Atherosclerosis of aorta: Secondary | ICD-10-CM | POA: Diagnosis not present

## 2022-12-09 DIAGNOSIS — I471 Supraventricular tachycardia, unspecified: Secondary | ICD-10-CM | POA: Diagnosis not present

## 2022-12-09 DIAGNOSIS — I10 Essential (primary) hypertension: Secondary | ICD-10-CM

## 2022-12-09 DIAGNOSIS — R072 Precordial pain: Secondary | ICD-10-CM

## 2022-12-09 DIAGNOSIS — E785 Hyperlipidemia, unspecified: Secondary | ICD-10-CM

## 2022-12-09 MED ORDER — METOPROLOL TARTRATE 100 MG PO TABS: ORAL_TABLET | ORAL | 0 refills | Status: AC

## 2022-12-09 NOTE — Addendum Note (Signed)
Addended by: Baldo Ash D on: 12/09/2022 01:30 PM   Modules accepted: Orders
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
## 2022-12-09 NOTE — Patient Instructions (Signed)
Medication Instructions:   Take Metoprolol Tartrate 50mg  1 tablet 2 hours prior to CT scan  *If you need a refill on your cardiac medications before your next appointment, please call your pharmacy*   Lab Work: BMP- today If you have labs (blood work) drawn today and your tests are completely normal, you will receive your results only by: MyChart Message (if you have MyChart) OR A paper copy in the mail If you have any lab test that is abnormal or we need to change your treatment, we will call you to review the results.   Testing/Procedures: Your physician has requested that you have cardiac CT. Cardiac computed tomography (CT) is a painless test that uses an x-ray machine to take clear, detailed pictures of your heart. For further information please visit [email or web address] Please follow instruction sheet as given.    Your Cardiac CT will be scheduled at:   [facility] located off [facility] at the hospital.  Please arrive 30 minutes prior to your appointment time.  You can use the FREE valet parking offered at entrance to outpatient center (encouraged to control the heart rate for the test)   Please follow these instructions carefully (unless otherwise directed):   On the Night Before the Test: Be sure to Drink plenty of water. Do not consume any caffeinated/decaffeinated beverages or chocolate 12 hours prior to your test. Do not take any antihistamines 12 hours prior to your test.   On the Day of the Test: Drink plenty of water until 1 hour prior to the test. Do not eat any food 4 hours prior to the test. No smoking 4 hours prior to test. You may take your regular medications prior to the test.  Take metoprolol (Lopressor) two hours prior to test. FEMALES- please wear underwire-free bra if available, avoid dresses & tight clothing. Wear plain shirt no beads, sparkles, rhinestones, metal or heavy embroidery.  After the Test: Drink plenty of  water. After receiving IV contrast, you may experience a mild flushed feeling. This is normal. On occasion, you may experience a mild rash up to 24 hours after the test. This is not dangerous. If this occurs, you can take Benadryl 25 mg and increase your fluid intake. If you experience trouble breathing, this can be serious. If it is severe call 911 IMMEDIATELY. If it is mild, please call our office. If you take any of these medications: Glipizide/Metformin, Avandament, Glucavance, please do not take 48 hours after completing test unless otherwise instructed.  We will call to schedule your test 2-4 weeks out understanding that some insurance companies will need an authorization prior to the service being performed.      Follow-Up: At [facility], you and your health needs are our priority.  As part of our continuing mission to provide you with exceptional heart care, we have created designated Provider Care Teams.  These Care Teams include your primary Cardiologist (physician) and Advanced Practice Providers (APPs -  Physician Assistants and Nurse Practitioners) who all work together to provide you with the care you need, when you need it.  We recommend signing up for the patient portal called "MyChart".  Sign up information is provided on this After Visit Summary.  MyChart is used to connect with patients for Virtual Visits (Telemedicine).  Patients are able to view lab/test results, encounter notes, upcoming appointments, etc.  Non-urgent messages can be sent to your provider as well.   To learn more about what you can do  with MyChart, go to [email or web address].    Your next appointment:   6 month(s)  The format for your next appointment:   In Person  Provider:   Gypsy Balsam, MD    Other Instructions Cardiac CT Angiogram A cardiac CT angiogram is a procedure to look at the heart and the area around the heart. It may be done to help find the cause of chest pains or  other symptoms of heart disease. During this procedure, a substance called contrast dye is injected into the blood vessels in the area to be checked. A large X-ray machine, called a CT scanner, then takes detailed pictures of the heart and the surrounding area. The procedure is also sometimes called a coronary CT angiogram, coronary artery scanning, or CTA. A cardiac CT angiogram allows the health care provider to see how well blood is flowing to and from the heart. The health care provider will be able to see if there are any problems, such as: Blockage or narrowing of the coronary arteries in the heart. Fluid around the heart. Signs of weakness or disease in the muscles, valves, and tissues of the heart. Tell a health care provider about: Any allergies you have. This is especially important if you have had a previous allergic reaction to contrast dye. All medicines you are taking, including vitamins, herbs, eye drops, creams, and over-the-counter medicines. Any blood disorders you have. Any surgeries you have had. Any medical conditions you have. Whether you are pregnant or may be pregnant. Any anxiety disorders, chronic pain, or other conditions you have that may increase your stress or prevent you from lying still. What are the risks? Generally, this is a safe procedure. However, problems may occur, including: Bleeding. Infection. Allergic reactions to medicines or dyes. Damage to other structures or organs. Kidney damage from the contrast dye that is used. Increased risk of cancer from radiation exposure. This risk is low. Talk with your health care provider about: The risks and benefits of testing. How you can receive the lowest dose of radiation. What happens before the procedure? Wear comfortable clothing and remove any jewelry, glasses, dentures, and hearing aids. Follow instructions from your health care provider about eating and drinking. This may include: For 12 hours before the  procedure -- avoid caffeine. This includes tea, coffee, soda, energy drinks, and diet pills. Drink plenty of water or other fluids that do not have caffeine in them. Being well hydrated can prevent complications. For 4-6 hours before the procedure -- stop eating and drinking. The contrast dye can cause nausea, but this is less likely if your stomach is empty. Ask your health care provider about changing or stopping your regular medicines. This is especially important if you are taking diabetes medicines, blood thinners, or medicines to treat problems with erections (erectile dysfunction). What happens during the procedure?  Hair on your chest may need to be removed so that small sticky patches called electrodes can be placed on your chest. These will transmit information that helps to monitor your heart during the procedure. An IV will be inserted into one of your veins. You might be given a medicine to control your heart rate during the procedure. This will help to ensure that good images are obtained. You will be asked to lie on an exam table. This table will slide in and out of the CT machine during the procedure. Contrast dye will be injected into the IV. You might feel warm, or you may get a metallic taste  in your mouth. You will be given a medicine called nitroglycerin. This will relax or dilate the arteries in your heart. The table that you are lying on will move into the CT machine tunnel for the scan. The person running the machine will give you instructions while the scans are being done. You may be asked to: Keep your arms above your head. Hold your breath. Stay very still, even if the table is moving. When the scanning is complete, you will be moved out of the machine. The IV will be removed. The procedure may vary among health care providers and hospitals. What can I expect after the procedure? After your procedure, it is common to have: A metallic taste in your mouth from the  contrast dye. A feeling of warmth. A headache from the nitroglycerin. Follow these instructions at home: Take over-the-counter and prescription medicines only as told by your health care provider. If you are told, drink enough fluid to keep your urine pale yellow. This will help to flush the contrast dye out of your body. Most people can return to their normal activities right after the procedure. Ask your health care provider what activities are safe for you. It is up to you to get the results of your procedure. Ask your health care provider, or the department that is doing the procedure, when your results will be ready. Keep all follow-up visits as told by your health care provider. This is important. Contact a health care provider if: You have any symptoms of allergy to the contrast dye. These include: Shortness of breath. Rash or hives. A racing heartbeat. Summary A cardiac CT angiogram is a procedure to look at the heart and the area around the heart. It may be done to help find the cause of chest pains or other symptoms of heart disease. During this procedure, a large X-ray machine, called a CT scanner, takes detailed pictures of the heart and the surrounding area after a contrast dye has been injected into blood vessels in the area. Ask your health care provider about changing or stopping your regular medicines before the procedure. This is especially important if you are taking diabetes medicines, blood thinners, or medicines to treat erectile dysfunction. If you are told, drink enough fluid to keep your urine pale yellow. This will help to flush the contrast dye out of your body. This information is not intended to replace advice given to you by your health care provider. Make sure you discuss any questions you have with your health care provider. Document Revised: 09/16/2021 Document Reviewed: 01/23/2019 Elsevier Patient Education  [place]

## 2022-12-10 ENCOUNTER — Other Ambulatory Visit: Payer: Self-pay | Admitting: Cardiology

## 2022-12-10 LAB — BASIC METABOLIC PANEL
BUN/Creatinine Ratio: 18 (ref 9–23)
BUN: 15 mg/dL (ref 6–24)
CO2: 26 mmol/L (ref 20–29)
Calcium: 9.5 mg/dL (ref 8.7–10.2)
Chloride: 102 mmol/L (ref 96–106)
Creatinine, Ser: 0.82 mg/dL (ref 0.57–1.00)
Glucose: 123 mg/dL — ABNORMAL HIGH (ref 70–99)
Potassium: 4.7 mmol/L (ref 3.5–5.2)
Sodium: 142 mmol/L (ref 134–144)
eGFR: 85 mL/min/{1.73_m2} (ref >59)

## 2022-12-11 ENCOUNTER — Other Ambulatory Visit: Payer: Self-pay | Admitting: Cardiology

## 2022-12-12 ENCOUNTER — Other Ambulatory Visit: Payer: Self-pay | Admitting: Cardiology

## 2022-12-26 ENCOUNTER — Telehealth: Payer: Self-pay | Admitting: Cardiology

## 2022-12-26 NOTE — Telephone Encounter (Signed)
[facility] called in stating pt is having a Cardiac CT done and they need the order faxed over before tomorrow morning   Fax: [phone]

## 2022-12-26 NOTE — Telephone Encounter (Signed)
Order faxed on 7-3- 24 to CT nurses at [phone] and refaxed today  per request to [phone]

## 2022-12-27 ENCOUNTER — Encounter: Payer: Self-pay | Admitting: Cardiology

## 2022-12-29 ENCOUNTER — Telehealth: Payer: Self-pay

## 2022-12-29 NOTE — Telephone Encounter (Signed)
-----   Message from Gypsy Balsam sent at 12/28/2022  8:29 AM EDT ----- Echocardiogram showed no evidence of coronary artery disease

## 2022-12-29 NOTE — Telephone Encounter (Signed)
Patient notified through my chart.

## 2023-01-05 ENCOUNTER — Encounter: Payer: Self-pay | Admitting: Cardiology

## 2023-01-10 ENCOUNTER — Telehealth: Payer: Self-pay

## 2023-01-10 NOTE — Telephone Encounter (Signed)
Pt viewed CT Angio results in My Chart per Dr. Vanetta Shawl note. Routed to PCP.

## 2023-04-17 ENCOUNTER — Other Ambulatory Visit: Payer: Self-pay | Admitting: Cardiology

## 2023-06-18 ENCOUNTER — Other Ambulatory Visit: Payer: Self-pay | Admitting: Cardiology

## 2023-12-10 ENCOUNTER — Other Ambulatory Visit: Payer: Self-pay | Admitting: Cardiology

## 2023-12-11 NOTE — Telephone Encounter (Signed)
 Rx refill sent to pharmacy.

## 2023-12-12 ENCOUNTER — Other Ambulatory Visit: Payer: Self-pay | Admitting: Cardiology
